# Patient Record
Sex: Male | Born: 1960
Health system: Southern US, Community
[De-identification: ages and names within clinical notes are randomized; demographics above are authoritative.]

## PROBLEM LIST (undated history)

## (undated) DIAGNOSIS — C439 Malignant melanoma of skin, unspecified: Secondary | ICD-10-CM

## (undated) DIAGNOSIS — M545 Low back pain, unspecified: Secondary | ICD-10-CM

## (undated) DIAGNOSIS — E785 Hyperlipidemia, unspecified: Secondary | ICD-10-CM

## (undated) DIAGNOSIS — T7840XA Allergy, unspecified, initial encounter: Secondary | ICD-10-CM

## (undated) DIAGNOSIS — N529 Male erectile dysfunction, unspecified: Secondary | ICD-10-CM

## (undated) DIAGNOSIS — I10 Essential (primary) hypertension: Secondary | ICD-10-CM

## (undated) DIAGNOSIS — G8929 Other chronic pain: Secondary | ICD-10-CM

## (undated) HISTORY — DX: Male erectile dysfunction, unspecified: N52.9

## (undated) HISTORY — DX: Low back pain, unspecified: M54.50

## (undated) HISTORY — DX: Allergy, unspecified, initial encounter: T78.40XA

## (undated) HISTORY — DX: Malignant melanoma of skin, unspecified: C43.9

## (undated) HISTORY — DX: Other chronic pain: G89.29

## (undated) HISTORY — DX: Hyperlipidemia, unspecified: E78.5

## (undated) HISTORY — PX: TONSILLECTOMY: SUR1361

## (undated) HISTORY — PX: BICEPS TENDON REPAIR: SHX566

## (undated) HISTORY — DX: Essential (primary) hypertension: I10

---

## 2010-04-17 ENCOUNTER — Emergency Department (HOSPITAL_COMMUNITY): Admission: EM | Admit: 2010-04-17 | Discharge: 2010-04-17 | Payer: Self-pay | Admitting: Family Medicine

## 2016-03-20 DIAGNOSIS — I10 Essential (primary) hypertension: Secondary | ICD-10-CM | POA: Diagnosis not present

## 2016-03-20 DIAGNOSIS — M545 Low back pain: Secondary | ICD-10-CM | POA: Diagnosis not present

## 2016-03-20 DIAGNOSIS — J309 Allergic rhinitis, unspecified: Secondary | ICD-10-CM | POA: Diagnosis not present

## 2016-03-20 DIAGNOSIS — L309 Dermatitis, unspecified: Secondary | ICD-10-CM | POA: Diagnosis not present

## 2016-05-16 DIAGNOSIS — R238 Other skin changes: Secondary | ICD-10-CM | POA: Diagnosis not present

## 2016-05-16 DIAGNOSIS — D485 Neoplasm of uncertain behavior of skin: Secondary | ICD-10-CM | POA: Diagnosis not present

## 2016-05-16 DIAGNOSIS — Z85828 Personal history of other malignant neoplasm of skin: Secondary | ICD-10-CM | POA: Diagnosis not present

## 2016-05-16 DIAGNOSIS — D225 Melanocytic nevi of trunk: Secondary | ICD-10-CM | POA: Diagnosis not present

## 2016-09-18 ENCOUNTER — Ambulatory Visit (INDEPENDENT_AMBULATORY_CARE_PROVIDER_SITE_OTHER): Payer: BLUE CROSS/BLUE SHIELD | Admitting: Allergy and Immunology

## 2016-09-18 ENCOUNTER — Encounter: Payer: Self-pay | Admitting: Allergy and Immunology

## 2016-09-18 VITALS — BP 148/80 | HR 77 | Temp 98.3°F | Resp 20 | Ht 68.5 in | Wt 184.2 lb

## 2016-09-18 DIAGNOSIS — H1013 Acute atopic conjunctivitis, bilateral: Secondary | ICD-10-CM

## 2016-09-18 DIAGNOSIS — J3089 Other allergic rhinitis: Secondary | ICD-10-CM

## 2016-09-18 DIAGNOSIS — H1010 Acute atopic conjunctivitis, unspecified eye: Secondary | ICD-10-CM

## 2016-09-18 DIAGNOSIS — R062 Wheezing: Secondary | ICD-10-CM

## 2016-09-18 HISTORY — DX: Acute atopic conjunctivitis, unspecified eye: H10.10

## 2016-09-18 HISTORY — DX: Other allergic rhinitis: J30.89

## 2016-09-18 HISTORY — DX: Wheezing: R06.2

## 2016-09-18 MED ORDER — AZELASTINE-FLUTICASONE 137-50 MCG/ACT NA SUSP: 1.0000 | Freq: Two times a day (BID) | NASAL | 5 refills | Status: DC | PRN

## 2016-09-18 MED ORDER — BECLOMETHASONE DIPROPIONATE 40 MCG/ACT IN AERS: 2.0000 | INHALATION_SPRAY | Freq: Two times a day (BID) | RESPIRATORY_TRACT | 1 refills | Status: DC

## 2016-09-18 MED ORDER — LEVOCETIRIZINE DIHYDROCHLORIDE 5 MG PO TABS: 5.0000 mg | ORAL_TABLET | Freq: Every evening | ORAL | 5 refills | Status: DC

## 2016-09-18 MED ORDER — OLOPATADINE HCL 0.2 % OP SOLN: 1.0000 [drp] | OPHTHALMIC | 5 refills | Status: AC

## 2016-09-18 MED ORDER — ALBUTEROL SULFATE 108 (90 BASE) MCG/ACT IN AEPB: 2.0000 | INHALATION_SPRAY | RESPIRATORY_TRACT | 1 refills | Status: AC

## 2016-09-18 NOTE — Assessment & Plan Note (Addendum)
   Aeroallergen avoidance measures have been discussed and provided in written form.  A prescription has been provided for levocetirizine, 5mg  daily as needed.  A prescription has been provided for Dymista (azelastine/fluticasone) nasal spray, 1 spray per nostril twice daily as needed. Proper nasal spray technique has been discussed and demonstrated.  I have also recommended nasal saline spray (i.e., Simply Saline) or nasal saline lavage (i.e., NeilMed) as needed prior to medicated nasal sprays. The risks and benefits of aeroallergen immunotherapy have been discussed. The patient is motivated to initiate immunotherapy if insurance coverage is favorable. He will let us know how he would like to proceed.

## 2016-09-18 NOTE — Assessment & Plan Note (Addendum)
The patient's history suggests asthma, however spirometry results today do not meet ATS criteria for that diagnosis.  For now, continue montelukast 10 mg daily bedtime.  A prescription has been provided for Qvar (beclomethasone) 40 g, 2 inhalations twice a day. To maximize pulmonary deposition, a spacer has been provided along with instructions for its proper administration with an HFA inhaler.  A prescription has been provided for ProAir Respiclick, 1-2 inhalations every 4-6 hours as needed.  Subjective and objective measures of pulmonary function will be followed and the treatment plan will be adjusted accordingly.

## 2016-09-18 NOTE — Patient Instructions (Addendum)
Perennial and seasonal allergic rhinitis  Aeroallergen avoidance measures have been discussed and provided in written form.  A prescription has been provided for levocetirizine, 5mg  daily as needed.  A prescription has been provided for Dymista (azelastine/fluticasone) nasal spray, 1 spray per nostril twice daily as needed. Proper nasal spray technique has been discussed and demonstrated.  I have also recommended nasal saline spray (i.e., Simply Saline) or nasal saline lavage (i.e., NeilMed) as needed prior to medicated nasal sprays. The risks and benefits of aeroallergen immunotherapy have been discussed. The patient is motivated to initiate immunotherapy if insurance coverage is favorable. He will let us know how he would like to proceed.  Allergic conjunctivitis  Treatment plan as outlined above for allergic rhinitis.  A prescription has been provided for Pazeo, one drop per eye daily as needed.  Dyspnea/wheezing The patient's history suggests asthma, however spirometry results today do not meet ATS criteria for that diagnosis.  For now, continue montelukast 10 mg daily bedtime.  A prescription has been provided for Qvar (beclomethasone) 40 g,  2 inhalations twice a day. To maximize pulmonary deposition, a spacer has been provided along with instructions for its proper administration with an HFA inhaler.  A prescription has been provided for ProAir Respiclick, 1-2 inhalations every 4-6 hours as needed.  Subjective and objective measures of pulmonary function will be followed and the treatment plan will be adjusted accordingly.   Return in about 3 months (around 12/19/2016), or if symptoms worsen or fail to improve.  Reducing Pollen Exposure  The American Academy of Allergy, Asthma and Immunology suggests the following steps to reduce your exposure to pollen during allergy seasons.    1. Do not hang sheets or clothing out to dry; pollen may collect on these items. 2. Do not mow  lawns or spend time around freshly cut grass; mowing stirs up pollen. 3. Keep windows closed at night.  Keep car windows closed while driving. 4. Minimize morning activities outdoors, a time when pollen counts are usually at their highest. 5. Stay indoors as much as possible when pollen counts or humidity is high and on windy days when pollen tends to remain in the air longer. 6. Use air conditioning when possible.  Many air conditioners have filters that trap the pollen spores. 7. Use a HEPA room air filter to remove pollen form the indoor air you breathe.   Control of House Dust Mite Allergen  House dust mites play a major role in allergic asthma and rhinitis.  They occur in environments with high humidity wherever human skin, the food for dust mites is found. High levels have been detected in dust obtained from mattresses, pillows, carpets, upholstered furniture, bed covers, clothes and soft toys.  The principal allergen of the house dust mite is found in its feces.  A gram of dust may contain 1,000 mites and 250,000 fecal particles.  Mite antigen is easily measured in the air during house cleaning activities.    1. Encase mattresses, including the box spring, and pillow, in an air tight cover.  Seal the zipper end of the encased mattresses with wide adhesive tape. 2. Wash the bedding in water of 130 degrees Farenheit weekly.  Avoid cotton comforters/quilts and flannel bedding: the most ideal bed covering is the dacron comforter. 3. Remove all upholstered furniture from the bedroom. 4. Remove carpets, carpet padding, rugs, and non-washable window drapes from the bedroom.  Wash drapes weekly or use plastic window coverings. 5. Remove all non-washable stuffed toys  from the bedroom.  Wash stuffed toys weekly. 6. Have the room cleaned frequently with a vacuum cleaner and a damp dust-mop.  The patient should not be in a room which is being cleaned and should wait 1 hour after cleaning before going  into the room. 7. Close and seal all heating outlets in the bedroom.  Otherwise, the room will become filled with dust-laden air.  An electric heater can be used to heat the room. Reduce indoor humidity to less than 50%.  Do not use a humidifier.  Control of Dog or Cat Allergen  Avoidance is the best way to manage a dog or cat allergy. If you have a dog or cat and are allergic to dog or cats, consider removing the dog or cat from the home. If you have a dog or cat but don't want to find it a new home, or if your family wants a pet even though someone in the household is allergic, here are some strategies that may help keep symptoms at bay:  1. Keep the pet out of your bedroom and restrict it to only a few rooms. Be advised that keeping the dog or cat in only one room will not limit the allergens to that room. 2. Don't pet, hug or kiss the dog or cat; if you do, wash your hands with soap and water. 3. High-efficiency particulate air (HEPA) cleaners run continuously in a bedroom or living room can reduce allergen levels over time. 4. Regular use of a high-efficiency vacuum cleaner or a central vacuum can reduce allergen levels. 5. Giving your dog or cat a bath at least once a week can reduce airborne allergen.  Control of Mold Allergen  Mold and fungi can grow on a variety of surfaces provided certain temperature and moisture conditions exist.  Outdoor molds grow on plants, decaying vegetation and soil.  The major outdoor mold, Alternaria and Cladosporium, are found in very high numbers during hot and dry conditions.  Generally, a late Summer - Fall peak is seen for common outdoor fungal spores.  Rain will temporarily lower outdoor mold spore count, but counts rise rapidly when the rainy period ends.  The most important indoor molds are Aspergillus and Penicillium.  Dark, humid and poorly ventilated basements are ideal sites for mold growth.  The next most common sites of mold growth are the bathroom  and the kitchen.  Outdoor [employer] 1. Use air conditioning and keep windows closed 2. Avoid exposure to decaying vegetation. 3. Avoid leaf raking. 4. Avoid grain handling. 5. Consider wearing a face mask if working in moldy areas.  Indoor Mold Control 1. Maintain humidity below 50%. 2. Clean washable surfaces with 5% bleach solution. 3. Remove sources e.g. Contaminated carpets.

## 2016-09-18 NOTE — Assessment & Plan Note (Signed)
   Treatment plan as outlined above for allergic rhinitis.  A prescription has been provided for Pazeo, one drop per eye daily as needed. 

## 2016-09-18 NOTE — Progress Notes (Signed)
New Patient Note  RE: Eric Kirk MRN: [id number] DOB: September 03, 1961 Date of Office Visit: 09/18/2016  Referring provider: No ref. provider found Primary care provider: Donnie Coffin, MD  Chief Complaint: Allergic Rhinitis ; Cough; and Wheezing   History of present illness: Eric Kirk is a 55 y.o. male presenting today for evaluation of rhinitis and possible asthma.  He reports that he has had allergic rhinitis since adolescence.  While he is a teenager he was started on aeroallergen immunotherapy, but discontinued this therapy after only one year and prior to reaching maintenance because his symptoms had improved.  However, his symptoms have gradually returned over the years.  He complains of nasal congestion, rhinorrhea, sneezing, nasal pruritus, ocular pruritus, and occasional sinus pressure.  These symptoms seem to be triggered by pollens exposure, dust exposure, and exposure to cats.  He reports that his symptoms have increased significantly over this past year while dating his girlfriend who has multiple cats and also acquiring a cat of his own.  He notes that his symptoms resolve completely when he travels out of town, only to resume when he returns home.  In addition to the nasal and ocular symptoms, he reports that over this past year he has been experiencing frequent coughing, dyspnea, and wheezing.  Several months ago he was prescribed montelukast with mild/moderate relief.  However, recently montelukast, loratadine/pseudoephedrine, and fluticasone nasal spray do not provide adequate symptom relief for his upper or lower respiratory tract symptoms.   Assessment and plan: Perennial and seasonal allergic rhinitis  Aeroallergen avoidance measures have been discussed and provided in written form.  A prescription has been provided for levocetirizine, 5mg  daily as needed.  A prescription has been provided for Dymista (azelastine/fluticasone) nasal spray, 1 spray per nostril twice daily  as needed. Proper nasal spray technique has been discussed and demonstrated.  I have also recommended nasal saline spray (i.e., Simply Saline) or nasal saline lavage (i.e., NeilMed) as needed prior to medicated nasal sprays. The risks and benefits of aeroallergen immunotherapy have been discussed. The patient is motivated to initiate immunotherapy if insurance coverage is favorable. He will let us know how he would like to proceed.  Allergic conjunctivitis  Treatment plan as outlined above for allergic rhinitis.  A prescription has been provided for Pazeo, one drop per eye daily as needed.  Dyspnea/wheezing The patient's history suggests asthma, however spirometry results today do not meet ATS criteria for that diagnosis.  For now, continue montelukast 10 mg daily bedtime.  A prescription has been provided for Qvar (beclomethasone) 40 g,  2 inhalations twice a day. To maximize pulmonary deposition, a spacer has been provided along with instructions for its proper administration with an HFA inhaler.  A prescription has been provided for ProAir Respiclick, 1-2 inhalations every 4-6 hours as needed.  Subjective and objective measures of pulmonary function will be followed and the treatment plan will be adjusted accordingly.   Meds ordered this encounter  Medications  . levocetirizine (XYZAL) 5 MG tablet    Sig: Take 1 tablet (5 mg total) by mouth every evening.    Dispense:  30 tablet    Refill:  5  . Azelastine-Fluticasone (DYMISTA) 137-50 MCG/ACT SUSP    Sig: Place 1 spray into both nostrils 2 (two) times daily as needed.    Dispense:  1 Bottle    Refill:  5  . Albuterol Sulfate (PROAIR RESPICLICK) 123XX123 (90 Base) MCG/ACT AEPB    Sig: Inhale 2 puffs into the  lungs every 4 (four) hours.    Dispense:  1 each    Refill:  1  . beclomethasone (QVAR) 40 MCG/ACT inhaler    Sig: Inhale 2 puffs into the lungs 2 (two) times daily.    Dispense:  8.7 g    Refill:  1  . Olopatadine HCl  (PATADAY) 0.2 % SOLN    Sig: Place 1 drop into both eyes 1 day or 1 dose.    Dispense:  1 Bottle    Refill:  5     Diagnostics: Spirometry: FVC was 4.93 L and FEV1 was 3.70 L without post bronchodilator improvement.  Please see scanned spirometry results for details. Allergy skin testing: Positive to grass pollen, ragweed pollen, weed pollens, molds, cat hair, dog epithelia, and dust mite antigen.    Physical examination: Blood pressure (!) 148/80, pulse 77, temperature 98.3 F (36.8 C), temperature source Oral, resp. rate 20, height 5' 8.5" (1.74 m), weight 184 lb 3.2 oz (83.6 kg), SpO2 94 %.  General: Alert, interactive, in no acute distress. HEENT: TMs pearly gray, turbinates edematous without discharge, post-pharynx moderately erythematous. Neck: Supple without lymphadenopathy. Lungs: Clear to auscultation without wheezing, rhonchi or rales. CV: Normal S1, S2 without murmurs. Abdomen: Nondistended, nontender. Skin: Warm and dry, without lesions or rashes. Extremities:  No clubbing, cyanosis or edema. Neuro:   Grossly intact.  Review of systems:  Review of systems negative except as noted in HPI / PMHx or noted below: Review of Systems  Constitutional: Negative.   HENT: Negative.   Eyes: Negative.   Respiratory: Negative.   Cardiovascular: Negative.   Gastrointestinal: Negative.   Genitourinary: Negative.   Musculoskeletal: Negative.   Skin: Negative.   Neurological: Negative.   Endo/Heme/Allergies: Negative.   Psychiatric/Behavioral: Negative.     Past medical history:  History reviewed. No pertinent past medical history.  Past surgical history:  History reviewed. No pertinent surgical history.  Family history: History reviewed. No pertinent family history.  Social history: Social History   Social History  . Marital status: Single    Spouse name: N/A  . Number of children: N/A  . Years of education: N/A   Occupational History  . Not on file.   Social  History Main Topics  . Smoking status: Never Smoker  . Smokeless tobacco: Never Used  . Alcohol use Yes  . Drug use: No  . Sexual activity: Yes    Partners: Female    Birth control/ protection: None   Other Topics Concern  . Not on file   Social History Narrative  . No narrative on file   Environmental History: The patient lives in an 56 year old house with hardwood floors throughout and central air/heat.  There are 3 cats in the house which have access to his bedroom.  He is a nonsmoker.    Medication List       Accurate as of 09/18/16  6:41 PM. Always use your most recent med list.          Albuterol Sulfate 108 (90 Base) MCG/ACT Aepb Commonly known as:  PROAIR RESPICLICK Inhale 2 puffs into the lungs every 4 (four) hours.   Azelastine-Fluticasone 137-50 MCG/ACT Susp Commonly known as:  DYMISTA Place 1 spray into both nostrils 2 (two) times daily as needed.   beclomethasone 40 MCG/ACT inhaler Commonly known as:  QVAR Inhale 2 puffs into the lungs 2 (two) times daily.   fluticasone 50 MCG/ACT nasal spray Commonly known as:  FLONASE Place 1 spray into  both nostrils daily.   levocetirizine 5 MG tablet Commonly known as:  XYZAL Take 1 tablet (5 mg total) by mouth every evening.   loratadine-pseudoephedrine 10-240 MG 24 hr tablet Commonly known as:  CLARITIN-D 24-hour Take 1 tablet by mouth daily as needed for allergies.   losartan-hydrochlorothiazide 50-12.5 MG tablet Commonly known as:  HYZAAR Take 1 tablet by mouth daily.   montelukast 10 MG tablet Commonly known as:  SINGULAIR Take 10 mg by mouth daily.   Olopatadine HCl 0.2 % Soln Commonly known as:  PATADAY Place 1 drop into both eyes 1 day or 1 dose.       Known medication allergies: No Known Allergies  I appreciate the opportunity to take part in Takashi's care. Please do not hesitate to contact me with questions.  Sincerely,   R. Edgar Frisk, MD

## 2016-09-19 ENCOUNTER — Telehealth: Payer: Self-pay

## 2016-09-19 MED ORDER — AZELASTINE HCL 0.15 % NA SOLN: 2.0000 | Freq: Two times a day (BID) | NASAL | 5 refills | Status: AC

## 2016-09-19 NOTE — Telephone Encounter (Signed)
Pt returned call - advsd of Dymista not being on his insurance formulary; sample Dymista and discount card will be pur up front for him to pick up.  Pt stated he has 2 bottles of Fluticasone (Flonase) and wants to know if he can just go back to using that instead of the [place].  Please advise.

## 2016-09-19 NOTE — Addendum Note (Signed)
Addended by: Martyn Malay on: 09/19/2016 03:35 PM   Modules accepted: Orders

## 2016-09-19 NOTE — Telephone Encounter (Signed)
He can take fluticasone.  It may be helpful to add azelastine nasal spray, 2 sprays per nostril twice a day.  Please call in the azelastine if his insurance covers it.  Also, please remind the patient to use nasal saline prior to medicated nasal sprays.

## 2016-09-25 ENCOUNTER — Telehealth: Payer: Self-pay | Admitting: Allergy and Immunology

## 2016-09-25 ENCOUNTER — Other Ambulatory Visit: Payer: Self-pay

## 2016-09-25 DIAGNOSIS — I10 Essential (primary) hypertension: Secondary | ICD-10-CM | POA: Diagnosis not present

## 2016-09-25 DIAGNOSIS — E7800 Pure hypercholesterolemia, unspecified: Secondary | ICD-10-CM | POA: Diagnosis not present

## 2016-09-25 DIAGNOSIS — Z23 Encounter for immunization: Secondary | ICD-10-CM | POA: Diagnosis not present

## 2016-09-25 DIAGNOSIS — Z125 Encounter for screening for malignant neoplasm of prostate: Secondary | ICD-10-CM | POA: Diagnosis not present

## 2016-09-25 DIAGNOSIS — Z0000 Encounter for general adult medical examination without abnormal findings: Secondary | ICD-10-CM | POA: Diagnosis not present

## 2016-09-25 DIAGNOSIS — J3089 Other allergic rhinitis: Secondary | ICD-10-CM

## 2016-09-25 MED ORDER — AZELASTINE-FLUTICASONE 137-50 MCG/ACT NA SUSP: 1.0000 | Freq: Two times a day (BID) | NASAL | 5 refills | Status: DC | PRN

## 2016-09-25 NOTE — Telephone Encounter (Signed)
Patient called and requested to speak to [facility]. He said there was some confusion with his prescriptions.

## 2016-09-25 NOTE — Telephone Encounter (Signed)
Pt req Dymista refill due to cost of Azelastine.

## 2016-09-25 NOTE — Telephone Encounter (Signed)
Returned pt cll - Pt requesting Rx for Dymista due to the cost for Azelastine being $84-$85. Pt states with the $14 coupon it much cheaper for him to go that route.   I will put in refill request doe Dymista for you to sign off on.

## 2016-09-25 NOTE — Telephone Encounter (Signed)
Dymista, 1-2 sp EN bid prn. Thanks.

## 2016-09-26 MED ORDER — AZELASTINE-FLUTICASONE 137-50 MCG/ACT NA SUSP: NASAL | 5 refills | Status: DC

## 2016-09-26 NOTE — Telephone Encounter (Signed)
Called in [place].  Noted patient has coupon for pharmacy.

## 2016-09-26 NOTE — Addendum Note (Signed)
Addended byOralia Rud M on: 09/26/2016 10:08 AM   Modules accepted: Orders

## 2016-11-16 ENCOUNTER — Other Ambulatory Visit: Payer: Self-pay | Admitting: Allergy and Immunology

## 2016-11-16 DIAGNOSIS — R062 Wheezing: Secondary | ICD-10-CM

## 2016-12-18 ENCOUNTER — Other Ambulatory Visit: Payer: Self-pay | Admitting: *Deleted

## 2016-12-18 MED ORDER — BECLOMETHASONE DIPROP HFA 40 MCG/ACT IN AERB: 2.0000 | INHALATION_SPRAY | Freq: Two times a day (BID) | RESPIRATORY_TRACT | 3 refills | Status: AC

## 2016-12-24 ENCOUNTER — Ambulatory Visit: Payer: BLUE CROSS/BLUE SHIELD | Admitting: Allergy and Immunology

## 2017-01-02 ENCOUNTER — Encounter: Payer: Self-pay | Admitting: Allergy & Immunology

## 2017-01-02 ENCOUNTER — Ambulatory Visit (INDEPENDENT_AMBULATORY_CARE_PROVIDER_SITE_OTHER): Payer: BLUE CROSS/BLUE SHIELD | Admitting: Allergy & Immunology

## 2017-01-02 VITALS — BP 136/84 | HR 82 | Resp 18

## 2017-01-02 DIAGNOSIS — J3089 Other allergic rhinitis: Secondary | ICD-10-CM

## 2017-01-02 DIAGNOSIS — J4530 Mild persistent asthma, uncomplicated: Secondary | ICD-10-CM | POA: Diagnosis not present

## 2017-01-02 NOTE — Patient Instructions (Addendum)
1. Perennial and seasonal allergic rhinitis - Continue with the fluticasone and azelastine.  - Ask pharmacist whether Patanase would be cheaper than azelastine. - Continue with Xyzal as needed. - Consider starting allergy shots for long-term control.   2. Mild persistent asthma, uncomplicated - Lung function looked good today. - We will not make any medication changes.   - Ask pharmacist whether Flovent would be cheaper than Qvar. - Daily controller medication(s): Qvar 25mcg two puffs twice daily - Rescue medications: ProAir 4 puffs every 4-6 hours as needed - Changes during respiratory infections or worsening symptoms: increase Qvar 13mcg to 4 puffs twice daily for TWO WEEKS. - Asthma control goals:  * Full participation in all desired activities (may need albuterol before activity) * Albuterol use two time or less a week on average (not counting use with activity) * Cough interfering with sleep two time or less a month * Oral steroids no more than once a year * No hospitalizations  3. Return in about 6 months (around 07/05/2017).  Please inform us of any Emergency Department visits, hospitalizations, or changes in symptoms. Call us before going to the ED for breathing or allergy symptoms since we might be able to fit you in for a sick visit. Feel free to contact us anytime with any questions, problems, or concerns.  It was a pleasure to meet you today! Happy spring!   Websites that have reliable patient information: 1. American Academy of Asthma, Allergy, and Immunology: www.aaaai.org 2. Food Allergy Research and Education (FARE): foodallergy.org 3. Mothers of Asthmatics: http://www.asthmacommunitynetwork.org 4. American College of Allergy, Asthma, and Immunology: www.acaai.org

## 2017-01-02 NOTE — Progress Notes (Signed)
FOLLOW UP  Date of Service/Encounter:  01/02/17   Assessment:   Perennial and seasonal allergic rhinitis  Mild persistent asthma, uncomplicated   Asthma Reportables:  Severity: mild persistent  Risk: low Control: well controlled   Plan/Recommendations:    1. Perennial and seasonal allergic rhinitis - Continue with the fluticasone and azelastine.  - Ask pharmacist whether Patanase would be cheaper than azelastine. - Continue with Xyzal as needed. - Consider starting allergy shots for long-term control.  - We did discuss allergen immunotherapy, but he was concerned with the costs as well as the time commitment. - Eric Kirk travels quite a bit for his job and therefore may not have the time to keep up with the shots. - I did discuss that he could get shots twice weekly to work up faster. - The typical trip is one week long, although he does go out of the country for trips up to 30 days at a time.   2. Mild persistent asthma, uncomplicated - Lung function looked good today. - We will not make any medication changes.   - Ask pharmacist whether Flovent would be cheaper than Qvar. - Daily controller medication(s): Qvar 68mcg two puffs twice daily - Rescue medications: ProAir 4 puffs every 4-6 hours as needed - Changes during respiratory infections or worsening symptoms: increase Qvar 34mcg to 4 puffs twice daily for TWO WEEKS. - Asthma control goals:  * Full participation in all desired activities (may need albuterol before activity) * Albuterol use two time or less a week on average (not counting use with activity) * Cough interfering with sleep two time or less a month * Oral steroids no more than once a year * No hospitalizations  3. Return in about 6 months (around 07/05/2017).   Subjective:   Eric Kirk is a 57 y.o. male presenting today for follow up of  Chief Complaint  Patient presents with  . Allergic Rhinitis     Doing Better since last OV  . Nasal  Congestion    Dymista does help    Eric Kirk has a history of the following: Patient Active Problem List   Diagnosis Date Noted  . Perennial and seasonal allergic rhinitis 09/18/2016  . Allergic conjunctivitis 09/18/2016  . Dyspnea/wheezing 09/18/2016    History obtained from: chart review and patient.  Eric Kirk was referred by Donnie Coffin, MD.     Eric Kirk is a 56 y.o. male presenting for a follow up visit. He was last seen in November 2017 by Dr. Verlin Fester is a new patient. At that time, he was having allergic rhinitis symptoms have continued since adolescence. He had testing that was positive to pollen, ragweed, weeds, molds, cat, dog, and dust mite. He was started on Qvar 40 g 2 inhalations twice daily as well as Singulair 10 mg. For his allergies, he was started on Dymista as well as Xyzal. He was also given a prescription for Pazeo.  Since the last visit, he has done fairly well. He is very pleased with how well he is doing. He does use his Qvar but feels like the Dymista has worked the best. He has not needed his rescue inhaler at all. Eric Kirk's asthma has been well controlled. He has not required rescue medication, experienced nocturnal awakenings due to lower respiratory symptoms, nor have activities of daily living been limited. He has had no ED visits or Urgent Care visits. He has not needed prednisone.   Eric Kirk does report that  his medications are rather pricey. He is currently spending around $200 monthly for the two sprays separately. He has been using Flonase for the last two weeks since he ran out of the azelastine and he can tell a worsening of his symptoms without the activity of the azelastine. He did think about allergy shots but he has reservations with it because he travels a lot for his job. Therefore the ability to increase his dose will be hampered. He did check with his insurance company about coverage but cannot remember how much it would be.   Otherwise, there  have been no changes to his past medical history, surgical history, family history, or social history.    Review of Systems: a 14-point review of systems is pertinent for what is mentioned in HPI.  Otherwise, all other systems were negative. Constitutional: negative other than that listed in the HPI Eyes: negative other than that listed in the HPI Ears, nose, mouth, throat, and face: negative other than that listed in the HPI Respiratory: negative other than that listed in the HPI Cardiovascular: negative other than that listed in the HPI Gastrointestinal: negative other than that listed in the HPI Genitourinary: negative other than that listed in the HPI Integument: negative other than that listed in the HPI Hematologic: negative other than that listed in the HPI Musculoskeletal: negative other than that listed in the HPI Neurological: negative other than that listed in the HPI Allergy/Immunologic: negative other than that listed in the HPI    Objective:   Blood pressure 136/84, pulse 82, resp. rate 18, SpO2 97 %. There is no height or weight on file to calculate BMI.   Physical Exam:  General: Alert, interactive, in no acute distress. Cooperative with the exam. Very friendly.  Eyes: No conjunctival injection present on the right, No conjunctival injection present on the left, PERRL bilaterally, No discharge on the right, No discharge on the left and No Horner-Trantas dots present Ears: Right TM pearly gray with normal light reflex, Left TM pearly gray with normal light reflex, Right TM intact without perforation and Left TM intact without perforation.  Nose/Throat: External nose within normal limits and septum midline, turbinates edematous and pale with clear discharge, post-pharynx erythematous without cobblestoning in the posterior oropharynx. Tonsils 2+ without exudates Neck: Supple without thyromegaly. Lungs: Clear to auscultation without wheezing, rhonchi or rales. No  increased work of breathing. CV: Normal S1/S2, no murmurs. Capillary refill <2 seconds.  Skin: Warm and dry, without lesions or rashes. Neuro:   Grossly intact. No focal deficits appreciated. Responsive to questions.   Diagnostic studies:  Spirometry: results normal (FEV1: 3.41/98%, FVC: 4.77/110%, FEV1/FVC: 71%).    Spirometry consistent with normal pattern.   Allergy Studies: None    Salvatore Marvel, MD [place] [facility]

## 2017-03-26 DIAGNOSIS — J309 Allergic rhinitis, unspecified: Secondary | ICD-10-CM | POA: Diagnosis not present

## 2017-03-26 DIAGNOSIS — E7800 Pure hypercholesterolemia, unspecified: Secondary | ICD-10-CM | POA: Diagnosis not present

## 2017-03-26 DIAGNOSIS — I10 Essential (primary) hypertension: Secondary | ICD-10-CM | POA: Diagnosis not present

## 2017-03-26 DIAGNOSIS — L309 Dermatitis, unspecified: Secondary | ICD-10-CM | POA: Diagnosis not present

## 2017-03-29 ENCOUNTER — Other Ambulatory Visit: Payer: Self-pay | Admitting: Allergy and Immunology

## 2017-03-29 DIAGNOSIS — H1013 Acute atopic conjunctivitis, bilateral: Secondary | ICD-10-CM

## 2017-03-29 DIAGNOSIS — J3089 Other allergic rhinitis: Secondary | ICD-10-CM

## 2017-07-08 ENCOUNTER — Ambulatory Visit: Payer: BLUE CROSS/BLUE SHIELD | Admitting: Allergy & Immunology

## 2017-07-08 DIAGNOSIS — J309 Allergic rhinitis, unspecified: Secondary | ICD-10-CM

## 2017-07-10 ENCOUNTER — Other Ambulatory Visit: Payer: Self-pay | Admitting: Allergy and Immunology

## 2017-07-10 DIAGNOSIS — J3089 Other allergic rhinitis: Secondary | ICD-10-CM

## 2017-07-10 DIAGNOSIS — H1013 Acute atopic conjunctivitis, bilateral: Secondary | ICD-10-CM

## 2017-10-14 DIAGNOSIS — Z0000 Encounter for general adult medical examination without abnormal findings: Secondary | ICD-10-CM | POA: Diagnosis not present

## 2017-10-14 DIAGNOSIS — E7800 Pure hypercholesterolemia, unspecified: Secondary | ICD-10-CM | POA: Diagnosis not present

## 2017-10-14 DIAGNOSIS — Z125 Encounter for screening for malignant neoplasm of prostate: Secondary | ICD-10-CM | POA: Diagnosis not present

## 2017-10-14 DIAGNOSIS — I10 Essential (primary) hypertension: Secondary | ICD-10-CM | POA: Diagnosis not present

## 2017-10-14 DIAGNOSIS — Z23 Encounter for immunization: Secondary | ICD-10-CM | POA: Diagnosis not present

## 2017-10-14 DIAGNOSIS — Z1211 Encounter for screening for malignant neoplasm of colon: Secondary | ICD-10-CM | POA: Diagnosis not present

## 2017-10-31 DIAGNOSIS — R739 Hyperglycemia, unspecified: Secondary | ICD-10-CM | POA: Diagnosis not present

## 2017-11-21 ENCOUNTER — Other Ambulatory Visit: Payer: Self-pay

## 2017-11-21 DIAGNOSIS — H1013 Acute atopic conjunctivitis, bilateral: Secondary | ICD-10-CM

## 2017-11-21 NOTE — Telephone Encounter (Signed)
Received fax for 90 day supply of olopatadine 0.2%. Patient was last seen 01/02/2017. Patient was to return in 6 months. Patient needs office visit.

## 2017-12-27 DIAGNOSIS — M5136 Other intervertebral disc degeneration, lumbar region: Secondary | ICD-10-CM | POA: Diagnosis not present

## 2017-12-27 DIAGNOSIS — M47816 Spondylosis without myelopathy or radiculopathy, lumbar region: Secondary | ICD-10-CM | POA: Diagnosis not present

## 2017-12-27 DIAGNOSIS — M5126 Other intervertebral disc displacement, lumbar region: Secondary | ICD-10-CM | POA: Diagnosis not present

## 2017-12-27 DIAGNOSIS — M546 Pain in thoracic spine: Secondary | ICD-10-CM | POA: Diagnosis not present

## 2017-12-27 DIAGNOSIS — M5416 Radiculopathy, lumbar region: Secondary | ICD-10-CM | POA: Diagnosis not present

## 2017-12-27 DIAGNOSIS — Q7649 Other congenital malformations of spine, not associated with scoliosis: Secondary | ICD-10-CM | POA: Diagnosis not present

## 2018-02-09 ENCOUNTER — Other Ambulatory Visit: Payer: Self-pay | Admitting: Allergy and Immunology

## 2018-02-09 DIAGNOSIS — J3089 Other allergic rhinitis: Secondary | ICD-10-CM

## 2018-02-09 DIAGNOSIS — H1013 Acute atopic conjunctivitis, bilateral: Secondary | ICD-10-CM

## 2018-03-08 ENCOUNTER — Other Ambulatory Visit: Payer: Self-pay | Admitting: Allergy and Immunology

## 2018-03-08 DIAGNOSIS — H1013 Acute atopic conjunctivitis, bilateral: Secondary | ICD-10-CM

## 2018-03-08 DIAGNOSIS — J3089 Other allergic rhinitis: Secondary | ICD-10-CM

## 2018-04-08 DIAGNOSIS — Z1159 Encounter for screening for other viral diseases: Secondary | ICD-10-CM | POA: Diagnosis not present

## 2018-04-08 DIAGNOSIS — J309 Allergic rhinitis, unspecified: Secondary | ICD-10-CM | POA: Diagnosis not present

## 2018-04-08 DIAGNOSIS — L309 Dermatitis, unspecified: Secondary | ICD-10-CM | POA: Diagnosis not present

## 2018-04-08 DIAGNOSIS — E7800 Pure hypercholesterolemia, unspecified: Secondary | ICD-10-CM | POA: Diagnosis not present

## 2018-04-08 DIAGNOSIS — I10 Essential (primary) hypertension: Secondary | ICD-10-CM | POA: Diagnosis not present

## 2018-11-11 DIAGNOSIS — Z0000 Encounter for general adult medical examination without abnormal findings: Secondary | ICD-10-CM | POA: Diagnosis not present

## 2018-11-11 DIAGNOSIS — E7800 Pure hypercholesterolemia, unspecified: Secondary | ICD-10-CM | POA: Diagnosis not present

## 2018-11-11 DIAGNOSIS — I10 Essential (primary) hypertension: Secondary | ICD-10-CM | POA: Diagnosis not present

## 2018-11-11 DIAGNOSIS — Z1211 Encounter for screening for malignant neoplasm of colon: Secondary | ICD-10-CM | POA: Diagnosis not present

## 2018-11-11 DIAGNOSIS — Z125 Encounter for screening for malignant neoplasm of prostate: Secondary | ICD-10-CM | POA: Diagnosis not present

## 2018-11-24 DIAGNOSIS — Z85820 Personal history of malignant melanoma of skin: Secondary | ICD-10-CM | POA: Diagnosis not present

## 2018-11-24 DIAGNOSIS — D2262 Melanocytic nevi of left upper limb, including shoulder: Secondary | ICD-10-CM | POA: Diagnosis not present

## 2018-11-24 DIAGNOSIS — Z85828 Personal history of other malignant neoplasm of skin: Secondary | ICD-10-CM | POA: Diagnosis not present

## 2018-11-24 DIAGNOSIS — D225 Melanocytic nevi of trunk: Secondary | ICD-10-CM | POA: Diagnosis not present

## 2019-05-09 DIAGNOSIS — R040 Epistaxis: Secondary | ICD-10-CM | POA: Diagnosis not present

## 2019-05-12 DIAGNOSIS — R350 Frequency of micturition: Secondary | ICD-10-CM | POA: Diagnosis not present

## 2019-05-12 DIAGNOSIS — E7800 Pure hypercholesterolemia, unspecified: Secondary | ICD-10-CM | POA: Diagnosis not present

## 2019-05-12 DIAGNOSIS — J309 Allergic rhinitis, unspecified: Secondary | ICD-10-CM | POA: Diagnosis not present

## 2019-05-12 DIAGNOSIS — L309 Dermatitis, unspecified: Secondary | ICD-10-CM | POA: Diagnosis not present

## 2019-05-12 DIAGNOSIS — I10 Essential (primary) hypertension: Secondary | ICD-10-CM | POA: Diagnosis not present

## 2019-11-27 DIAGNOSIS — D2261 Melanocytic nevi of right upper limb, including shoulder: Secondary | ICD-10-CM | POA: Diagnosis not present

## 2019-11-27 DIAGNOSIS — Z85828 Personal history of other malignant neoplasm of skin: Secondary | ICD-10-CM | POA: Diagnosis not present

## 2019-11-27 DIAGNOSIS — D2262 Melanocytic nevi of left upper limb, including shoulder: Secondary | ICD-10-CM | POA: Diagnosis not present

## 2019-11-27 DIAGNOSIS — L821 Other seborrheic keratosis: Secondary | ICD-10-CM | POA: Diagnosis not present

## 2019-12-04 DIAGNOSIS — Z0000 Encounter for general adult medical examination without abnormal findings: Secondary | ICD-10-CM | POA: Diagnosis not present

## 2019-12-08 DIAGNOSIS — E7800 Pure hypercholesterolemia, unspecified: Secondary | ICD-10-CM | POA: Diagnosis not present

## 2019-12-08 DIAGNOSIS — Z125 Encounter for screening for malignant neoplasm of prostate: Secondary | ICD-10-CM | POA: Diagnosis not present

## 2019-12-08 DIAGNOSIS — Z23 Encounter for immunization: Secondary | ICD-10-CM | POA: Diagnosis not present

## 2019-12-08 DIAGNOSIS — I10 Essential (primary) hypertension: Secondary | ICD-10-CM | POA: Diagnosis not present

## 2019-12-09 ENCOUNTER — Encounter: Payer: Self-pay | Admitting: Cardiology

## 2019-12-09 DIAGNOSIS — Z1211 Encounter for screening for malignant neoplasm of colon: Secondary | ICD-10-CM | POA: Diagnosis not present

## 2020-01-14 ENCOUNTER — Ambulatory Visit: Payer: Self-pay | Attending: Internal Medicine

## 2020-01-14 DIAGNOSIS — Z23 Encounter for immunization: Secondary | ICD-10-CM

## 2020-01-14 NOTE — Progress Notes (Signed)
   Covid-19 Vaccination Clinic  Name:  Eric Kirk    MRN: [id number] DOB: [birth year]  01/14/2020  [name] was observed post Covid-19 immunization for 15 minutes without incident. He was provided with Vaccine Information Sheet and instruction to access the V-Safe system.   [name] was instructed to call 911 with any severe reactions post vaccine: Marland Kitchen Difficulty breathing  . Swelling of face and throat  . A fast heartbeat  . A bad rash all over body  . Dizziness and weakness   Immunizations Administered    Name Date Dose VIS Date Route   Pfizer COVID-19 Vaccine 01/14/2020  9:59 AM 0.3 mL 10/09/2019 Intramuscular   Manufacturer: [place]   Lot: [id number]   [place]: [id number]

## 2020-02-08 ENCOUNTER — Ambulatory Visit: Payer: Self-pay | Attending: Internal Medicine

## 2020-02-08 DIAGNOSIS — Z23 Encounter for immunization: Secondary | ICD-10-CM

## 2020-02-08 NOTE — Progress Notes (Signed)
   Covid-19 Vaccination Clinic  Name:  Eric Kirk    MRN: [id number] DOB: 06-20-1961  02/08/2020  Mr. [name] was observed post Covid-19 immunization for 15 minutes without incident. He was provided with Vaccine Information Sheet and instruction to access the V-Safe system.   Mr. [name] was instructed to call 911 with any severe reactions post vaccine: Marland Kitchen Difficulty breathing  . Swelling of face and throat  . A fast heartbeat  . A bad rash all over body  . Dizziness and weakness   Immunizations Administered    Name Date Dose VIS Date Route   Pfizer COVID-19 Vaccine 02/08/2020  9:36 AM 0.3 mL 10/09/2019 Intramuscular   Manufacturer: [place]   Lot: [id number]   [place]: [id number]

## 2020-02-10 DIAGNOSIS — S92912A Unspecified fracture of left toe(s), initial encounter for closed fracture: Secondary | ICD-10-CM | POA: Diagnosis not present

## 2020-02-10 DIAGNOSIS — M79675 Pain in left toe(s): Secondary | ICD-10-CM | POA: Insufficient documentation

## 2020-02-16 DIAGNOSIS — M25522 Pain in left elbow: Secondary | ICD-10-CM | POA: Insufficient documentation

## 2020-02-29 DIAGNOSIS — S46212A Strain of muscle, fascia and tendon of other parts of biceps, left arm, initial encounter: Secondary | ICD-10-CM | POA: Diagnosis not present

## 2020-02-29 DIAGNOSIS — S46292A Other injury of muscle, fascia and tendon of other parts of biceps, left arm, initial encounter: Secondary | ICD-10-CM | POA: Diagnosis not present

## 2020-02-29 DIAGNOSIS — Y999 Unspecified external cause status: Secondary | ICD-10-CM | POA: Diagnosis not present

## 2020-02-29 DIAGNOSIS — G8918 Other acute postprocedural pain: Secondary | ICD-10-CM | POA: Diagnosis not present

## 2020-02-29 DIAGNOSIS — X58XXXA Exposure to other specified factors, initial encounter: Secondary | ICD-10-CM | POA: Diagnosis not present

## 2020-03-07 DIAGNOSIS — M25522 Pain in left elbow: Secondary | ICD-10-CM | POA: Diagnosis not present

## 2020-03-09 DIAGNOSIS — Z4789 Encounter for other orthopedic aftercare: Secondary | ICD-10-CM | POA: Insufficient documentation

## 2020-03-15 DIAGNOSIS — Z7189 Other specified counseling: Secondary | ICD-10-CM | POA: Insufficient documentation

## 2020-03-15 DIAGNOSIS — R9431 Abnormal electrocardiogram [ECG] [EKG]: Secondary | ICD-10-CM | POA: Insufficient documentation

## 2020-03-15 HISTORY — DX: Abnormal electrocardiogram (ECG) (EKG): R94.31

## 2020-03-15 HISTORY — DX: Other specified counseling: Z71.89

## 2020-03-15 NOTE — Progress Notes (Signed)
Cardiology Office Note   Date:  03/16/2020   ID:  Eric Kirk Kirk, DOB Dec 14, 1960, MRN [id number]  PCP:  Eric Kirk Graff.Eric Kirk Sa, MD  Cardiologist:   No primary care provider on file. Referring:  Eric Kirk Kirk, L.Eric Kirk Sa, MD  Chief Complaint  Patient presents with  . Abnormal ECG      History of Present Illness: Eric Kirk Kirk is a 59 y.o. male who is referred by Eric Kirk Kirk, L.Eric Kirk Sa, MD for evaluation of an abnormal EKG. he was found to have left bundle branch block when he had elbow surgery recently.  This is new compared to 2005 EKGs.  He has not had any other cardiac work-up or cardiac problems in the past.  He was going to the gym before they closed.  He has been walking since Covid.  He denies any cardiovascular symptoms. The patient denies any new symptoms such as chest discomfort, neck or arm discomfort. There has been no new shortness of breath, PND or orthopnea. There have been no reported palpitations, presyncope or syncope.   Past Medical History:  Diagnosis Date  . Allergies    Cats  . Chronic low back pain   . Dyslipidemia   . Erectile dysfunction   . HTN (hypertension)   . Melanoma [facility])     Past Surgical History:  Procedure Laterality Date  . BICEPS TENDON REPAIR    . TONSILLECTOMY       Current Outpatient Medications  Medication Sig Dispense Refill  . Albuterol Sulfate (PROAIR RESPICLICK) 123XX123 (90 Base) MCG/ACT AEPB Inhale 2 puffs into the lungs every 4 (four) hours. (Patient not taking: Reported on 01/02/2017) 1 each 1  . Azelastine HCl 0.15 % SOLN Place 2 sprays into both nostrils 2 (two) times daily. (Patient not taking: Reported on 01/02/2017) 30 mL 5  . Azelastine-Fluticasone (DYMISTA) 137-50 MCG/ACT SUSP One to two sprays each nostril twice a day as needed. 23 g 5  . Beclomethasone Diprop HFA (QVAR REDIHALER) 40 MCG/ACT AERB Inhale 2 puffs into the lungs 2 (two) times daily. 1 Inhaler 3  . fluticasone (FLONASE) 50 MCG/ACT nasal spray Place 1 spray into both nostrils daily.      Marland Kitchen levocetirizine (XYZAL) 5 MG tablet TAKE 1 TABLET BY MOUTH EVERY EVENING 30 tablet 2  . loratadine-pseudoephedrine (CLARITIN-D 24-HOUR) 10-240 MG 24 hr tablet Take 1 tablet by mouth daily as needed for allergies.    Marland Kitchen losartan-hydrochlorothiazide (HYZAAR) 50-12.5 MG tablet Take 1 tablet by mouth daily.    . montelukast (SINGULAIR) 10 MG tablet Take 10 mg by mouth daily.    . Olopatadine HCl (PATADAY) 0.2 % SOLN Place 1 drop into both eyes 1 day or 1 dose. 1 Bottle 5  . QVAR 40 MCG/ACT inhaler INHALE 2 PUFFS INTO THE LUNGS TWICE A DAY (Patient not taking: Reported on 01/02/2017) 8.7 g 3   No current facility-administered medications for this visit.    Allergies:   Patient has no known allergies.    Social History:  The patient  reports that he quit smoking about 40 years ago. His smoking use included cigarettes. He has never used smokeless tobacco. He reports current alcohol use. He reports that he does not use drugs.   Family History:  The patient's family history includes Asthma in his son; CAD (age of onset: 19) in his mother; Lung cancer in his father.    ROS:  Please see the history of present illness.   Otherwise, review of systems are positive for none.  All other systems are reviewed and negative.    PHYSICAL EXAM: VS:  BP 135/70   Pulse 72   Temp (!) 96.8 F (36 C)   Ht 5\' 11"  (1.803 m)   Wt 185 lb (83.9 kg)   SpO2 94%   BMI 25.80 kg/m  , BMI Body mass index is 25.8 kg/m. GENERAL:  Well appearing HEENT:  Pupils equal round and reactive, fundi not visualized, oral mucosa unremarkable NECK:  No jugular venous distention, waveform within normal limits, carotid upstroke brisk and symmetric, no bruits, no thyromegaly LYMPHATICS:  No cervical, inguinal adenopathy LUNGS:  Clear to auscultation bilaterally BACK:  No CVA tenderness CHEST:  Unremarkable HEART:  PMI not displaced or sustained,S1 and S2 within normal limits, no S3, no S4, no clicks, no rubs, 3 out of 6 apical mid  to later peaking systolic murmur radiating up the aortic outflow tract and into the carotids, no diastolic murmurs ABD:  Flat, positive bowel sounds normal in frequency in pitch, no bruits, no rebound, no guarding, no midline pulsatile mass, no hepatomegaly, no splenomegaly EXT:  2 plus pulses throughout, no edema, no cyanosis no clubbing SKIN:  No rashes no nodules NEURO:  Cranial nerves II through XII grossly intact, motor grossly intact throughout PSYCH:  Cognitively intact, oriented to person place and time    EKG:  EKG is ordered today. The ekg ordered today demonstrates left bundle branch block, rate 72, axis within normal limits, intervals within normal limits, no acute ST-T wave changes.   Recent Labs: No results found for requested labs within last 8760 hours.    Lipid Panel No results found for: CHOL, TRIG, HDL, CHOLHDL, VLDL, LDLCALC, LDLDIRECT    Wt Readings from Last 3 Encounters:  03/16/20 185 lb (83.9 kg)  09/18/16 184 lb 3.2 oz (83.6 kg)      Other studies Reviewed: Additional studies/ records that were reviewed today include: Office records. Review of the above records demonstrates:  Please see elsewhere in the note.     ASSESSMENT AND PLAN:  ABNORMAL EKG:   The patient has a left bundle branch block.  This is possibly related to the murmur which I suspect to be aortic stenosis.  I Minna start with an echocardiogram.  Further evaluation will be based on these results.  I am going to have a low threshold for at least coronary calcium scoring as well pending the results of the echo.  HTN:   Blood pressures well controlled.  No change in therapy.  COVID EDUCATION:   The patient has had his vaccine.  Current medicines are reviewed at length with the patient today.  The patient does not have concerns regarding medicines.  The following changes have been made:  no change  Labs/ tests ordered today include:   Orders Placed This Encounter  Procedures  . EKG  12-Lead  . ECHOCARDIOGRAM COMPLETE     Disposition:   FU with me in six months or sooner based on the results of the above.   Signed, Minus Breeding, MD  03/16/2020 2:28 PM    [place]

## 2020-03-16 ENCOUNTER — Encounter: Payer: Self-pay | Admitting: Cardiology

## 2020-03-16 ENCOUNTER — Other Ambulatory Visit: Payer: Self-pay

## 2020-03-16 ENCOUNTER — Ambulatory Visit: Payer: BC Managed Care – PPO | Admitting: Cardiology

## 2020-03-16 VITALS — BP 135/70 | HR 72 | Temp 96.8°F | Ht 71.0 in | Wt 185.0 lb

## 2020-03-16 DIAGNOSIS — Z7189 Other specified counseling: Secondary | ICD-10-CM

## 2020-03-16 DIAGNOSIS — R9431 Abnormal electrocardiogram [ECG] [EKG]: Secondary | ICD-10-CM | POA: Diagnosis not present

## 2020-03-16 DIAGNOSIS — R011 Cardiac murmur, unspecified: Secondary | ICD-10-CM | POA: Diagnosis not present

## 2020-03-16 NOTE — Patient Instructions (Signed)
Medication Instructions:  NO CHANGES *If you need a refill on your cardiac medications before your next appointment, please call your pharmacy*  Lab Work: NONE ORDERED THIS VISIT  Testing/Procedures: Your physician has requested that you have an echocardiogram. Echocardiography is a painless test that uses sound waves to create images of your heart. It provides your doctor with information about the size and shape of your heart and how well your heart's chambers and valves are working. This procedure takes approximately one hour. There are no restrictions for this procedure. [place]  Follow-Up: At [facility], you and your health needs are our priority.  As part of our continuing mission to provide you with exceptional heart care, we have created designated Provider Care Teams.  These Care Teams include your primary Cardiologist (physician) and Advanced Practice Providers (APPs -  Physician Assistants and Nurse Practitioners) who all work together to provide you with the care you need, when you need it.  Your next appointment:   6 month(s) You will receive a reminder letter in the mail two months in advance. If you don't receive a letter, please call our office to schedule the follow-up appointment.  The format for your next appointment:   In Person  Provider:   Minus Breeding, MD

## 2020-03-21 DIAGNOSIS — M25522 Pain in left elbow: Secondary | ICD-10-CM | POA: Diagnosis not present

## 2020-04-01 DIAGNOSIS — M25522 Pain in left elbow: Secondary | ICD-10-CM | POA: Diagnosis not present

## 2020-04-07 DIAGNOSIS — M25522 Pain in left elbow: Secondary | ICD-10-CM | POA: Diagnosis not present

## 2020-04-11 ENCOUNTER — Ambulatory Visit (HOSPITAL_COMMUNITY): Payer: BC Managed Care – PPO | Attending: Cardiology

## 2020-04-11 ENCOUNTER — Other Ambulatory Visit: Payer: Self-pay

## 2020-04-11 DIAGNOSIS — R011 Cardiac murmur, unspecified: Secondary | ICD-10-CM | POA: Diagnosis not present

## 2020-04-13 DIAGNOSIS — M25522 Pain in left elbow: Secondary | ICD-10-CM | POA: Diagnosis not present

## 2020-04-18 ENCOUNTER — Telehealth: Payer: Self-pay

## 2020-04-18 DIAGNOSIS — R9431 Abnormal electrocardiogram [ECG] [EKG]: Secondary | ICD-10-CM

## 2020-04-18 NOTE — Telephone Encounter (Signed)
Spoke with patient. Reviewed recommendation from Dr. Percival Spanish. Patient is going to consider CT calcium score, will call back to schedule or cancel order.

## 2020-04-18 NOTE — Telephone Encounter (Signed)
-----   Message from Minus Breeding, MD sent at 04/17/2020  8:54 PM EDT ----- There is some septal hypertrophy.  I will follow this with another echo in one year.  No symptoms associated with this and no need for a change in therapy.  I would like for him to have a coronary calcium score and see me in a couple of months.  Call Mr. Grove with the results and send results to [facility], L.Marlou Sa, MD

## 2020-04-20 DIAGNOSIS — M25522 Pain in left elbow: Secondary | ICD-10-CM | POA: Diagnosis not present

## 2020-04-27 DIAGNOSIS — M25522 Pain in left elbow: Secondary | ICD-10-CM | POA: Diagnosis not present

## 2020-05-04 DIAGNOSIS — M25522 Pain in left elbow: Secondary | ICD-10-CM | POA: Diagnosis not present

## 2020-05-11 DIAGNOSIS — M25522 Pain in left elbow: Secondary | ICD-10-CM | POA: Diagnosis not present

## 2020-05-17 DIAGNOSIS — M25522 Pain in left elbow: Secondary | ICD-10-CM | POA: Diagnosis not present

## 2020-05-25 ENCOUNTER — Telehealth: Payer: Self-pay | Admitting: Cardiology

## 2020-05-25 NOTE — Telephone Encounter (Signed)
Patient is due for follow up 07/2020 but has appt in August and wants to wait til he is seen before scheduling another appointment

## 2020-06-06 ENCOUNTER — Ambulatory Visit (INDEPENDENT_AMBULATORY_CARE_PROVIDER_SITE_OTHER)
Admission: RE | Admit: 2020-06-06 | Discharge: 2020-06-06 | Disposition: A | Discharge status: Home / Self Care | Payer: Self-pay | Source: Ambulatory Visit | Attending: Cardiology | Admitting: Cardiology

## 2020-06-06 ENCOUNTER — Other Ambulatory Visit: Payer: Self-pay

## 2020-06-06 DIAGNOSIS — R9431 Abnormal electrocardiogram [ECG] [EKG]: Secondary | ICD-10-CM

## 2020-06-10 DIAGNOSIS — E7800 Pure hypercholesterolemia, unspecified: Secondary | ICD-10-CM | POA: Diagnosis not present

## 2020-06-10 DIAGNOSIS — Z23 Encounter for immunization: Secondary | ICD-10-CM | POA: Diagnosis not present

## 2020-06-10 DIAGNOSIS — L309 Dermatitis, unspecified: Secondary | ICD-10-CM | POA: Diagnosis not present

## 2020-06-10 DIAGNOSIS — J309 Allergic rhinitis, unspecified: Secondary | ICD-10-CM | POA: Diagnosis not present

## 2020-06-10 DIAGNOSIS — I10 Essential (primary) hypertension: Secondary | ICD-10-CM | POA: Diagnosis not present

## 2020-06-16 DIAGNOSIS — I517 Cardiomegaly: Secondary | ICD-10-CM

## 2020-06-16 DIAGNOSIS — R931 Abnormal findings on diagnostic imaging of heart and coronary circulation: Secondary | ICD-10-CM

## 2020-06-16 DIAGNOSIS — I10 Essential (primary) hypertension: Secondary | ICD-10-CM | POA: Insufficient documentation

## 2020-06-16 DIAGNOSIS — I422 Other hypertrophic cardiomyopathy: Secondary | ICD-10-CM | POA: Insufficient documentation

## 2020-06-16 HISTORY — DX: Abnormal findings on diagnostic imaging of heart and coronary circulation: R93.1

## 2020-06-16 HISTORY — DX: Cardiomegaly: I51.7

## 2020-06-16 NOTE — Progress Notes (Signed)
Cardiology Office Note   Date:  06/17/2020   ID:  Eric Kirk, DOB 23-Oct-1961, MRN [id number]  PCP:  Aurea Graff.Marlou Sa, MD  Cardiologist:   No primary care provider on file.   Chief Complaint  Patient presents with  . Abnormal ECG      History of Present Illness: Eric Kirk is a 59 y.o. male who was referred by Alroy Dust, L.Marlou Sa, MD for evaluation of an abnormal EKG.   He was found to have left bundle branch block new compared to 2005 EKGs.   I sent him for an echo and he had septal hypertrophy.  He had mild coronary calcium on coronary calcium score.  He returns to discuss these findings.     He has had no new symptoms.  He denies any chest pressure, neck or arm discomfort.  He does some activities.  He has some back pain.  He has no new shortness of breath, PND or orthopnea.  He has no palpitations, presyncope or syncope.  He has had no weight gain.  He does have occasional edema when he travels.     Past Medical History:  Diagnosis Date  . Allergies    Cats  . Chronic low back pain   . Dyslipidemia   . Erectile dysfunction   . HTN (hypertension)   . Melanoma [facility])     Past Surgical History:  Procedure Laterality Date  . BICEPS TENDON REPAIR    . TONSILLECTOMY       Current Outpatient Medications  Medication Sig Dispense Refill  . Albuterol Sulfate (PROAIR RESPICLICK) 841 (90 Base) MCG/ACT AEPB Inhale 2 puffs into the lungs every 4 (four) hours. 1 each 1  . Azelastine HCl 0.15 % SOLN Place 2 sprays into both nostrils 2 (two) times daily. 30 mL 5  . Azelastine-Fluticasone (DYMISTA) 137-50 MCG/ACT SUSP One to two sprays each nostril twice a day as needed. 23 g 5  . Beclomethasone Diprop HFA (QVAR REDIHALER) 40 MCG/ACT AERB Inhale 2 puffs into the lungs 2 (two) times daily. 1 Inhaler 3  . fluticasone (FLONASE) 50 MCG/ACT nasal spray Place 1 spray into both nostrils daily.    Marland Kitchen levocetirizine (XYZAL) 5 MG tablet TAKE 1 TABLET BY MOUTH EVERY EVENING 30 tablet 2  .  montelukast (SINGULAIR) 5 MG chewable tablet Chew 5 mg by mouth at bedtime.    . Olopatadine HCl (PATADAY) 0.2 % SOLN Place 1 drop into both eyes 1 day or 1 dose. 1 Bottle 5  . amLODipine (NORVASC) 5 MG tablet Take 1 tablet (5 mg total) by mouth daily. 90 tablet 3  . pravastatin (PRAVACHOL) 40 MG tablet Take 1 tablet (40 mg total) by mouth every evening. 90 tablet 3   No current facility-administered medications for this visit.    Allergies:   Other    ROS:  Please see the history of present illness.   Otherwise, review of systems are positive for ED.   All other systems are reviewed and negative.    PHYSICAL EXAM: VS:  BP 140/74   Pulse 60   Ht 5\' 11"  (1.803 m)   Wt 187 lb 3.2 oz (84.9 kg)   SpO2 96%   BMI 26.11 kg/m  , BMI Body mass index is 26.11 kg/m. GENERAL:  Well appearing NECK:  No jugular venous distention, waveform within normal limits, carotid upstroke brisk and symmetric, no bruits, no thyromegaly LUNGS:  Clear to auscultation bilaterally CHEST:  Unremarkable HEART:  PMI not displaced  or sustained,S1 and S2 within normal limits, no S3, no S4, no clicks, no rubs, 3 out of 6 apical systolic murmur radiating slightly out aortic outflow tract and into the carotids not increasing with the strain phase of Valsalva, no diastolic murmurs ABD:  Flat, positive bowel sounds normal in frequency in pitch, no bruits, no rebound, no guarding, no midline pulsatile mass, no hepatomegaly, no splenomegaly EXT:  2 plus pulses throughout, no edema, no cyanosis no clubbing    EKG:  EKG is not ordered today.    Recent Labs: No results found for requested labs within last 8760 hours.    Lipid Panel No results found for: CHOL, TRIG, HDL, CHOLHDL, VLDL, LDLCALC, LDLDIRECT    Wt Readings from Last 3 Encounters:  06/17/20 187 lb 3.2 oz (84.9 kg)  03/16/20 185 lb (83.9 kg)  09/18/16 184 lb 3.2 oz (83.6 kg)      Other studies Reviewed: Additional studies/ records that were reviewed  today include: Echo, CT. Review of the above records demonstrates:  Please see elsewhere in the note.     ASSESSMENT AND PLAN:  SEPTAL HYPERTROPHY:    He should have an MRI to further evaluate.  Based on these results I will consider genetic testing.  This will help risk stratify for risk of sudden death but based on absence of family history other findings currently he is low risk.   CORONARY CALCIUM: His coronary calcium was 9.59 which is 39th percentile.  He has absolutely no symptoms and he is exercising.  Therefore, no further cardiovascular testing is suggested.  He should continue with risk reduction as below.  HTN:   His blood pressure is not at target.  He wants to try to come off the Hyzaar as he thinks it might be causing some erectile dysfunction so we will stop this and start Norvasc 5 mg daily.  He will keep a blood pressure diary.  DYSLIPIDEMIA: His MESA score is 7.4.  After long discussion with him he would like to try something to get to a goal LDL at least less than 100.  Is currently 133 although his HDL is 59.  I will start pravastatin 40 mg daily.  COVID EDUCATION:   The patient has had his vaccine.    Current medicines are reviewed at length with the patient today.  The patient does not have concerns regarding medicines.  The following changes have been made: As above.    Labs/ tests ordered today include:   Orders Placed This Encounter  Procedures  . MR CARDIAC MORPHOLOGY WO CONTRAST     Disposition:   FU with me in 3 months.     Signed, Minus Breeding, MD  06/17/2020 11:27 AM    [facility]

## 2020-06-17 ENCOUNTER — Other Ambulatory Visit: Payer: Self-pay

## 2020-06-17 ENCOUNTER — Ambulatory Visit: Payer: BC Managed Care – PPO | Admitting: Cardiology

## 2020-06-17 ENCOUNTER — Encounter: Payer: Self-pay | Admitting: Cardiology

## 2020-06-17 VITALS — BP 140/74 | HR 60 | Ht 71.0 in | Wt 187.2 lb

## 2020-06-17 DIAGNOSIS — I10 Essential (primary) hypertension: Secondary | ICD-10-CM | POA: Diagnosis not present

## 2020-06-17 DIAGNOSIS — I517 Cardiomegaly: Secondary | ICD-10-CM

## 2020-06-17 DIAGNOSIS — R931 Abnormal findings on diagnostic imaging of heart and coronary circulation: Secondary | ICD-10-CM

## 2020-06-17 DIAGNOSIS — I422 Other hypertrophic cardiomyopathy: Secondary | ICD-10-CM

## 2020-06-17 MED ORDER — PRAVASTATIN SODIUM 40 MG PO TABS: 40.0000 mg | ORAL_TABLET | Freq: Every evening | ORAL | 3 refills | Status: DC

## 2020-06-17 MED ORDER — AMLODIPINE BESYLATE 5 MG PO TABS: 5.0000 mg | ORAL_TABLET | Freq: Every day | ORAL | 3 refills | Status: DC

## 2020-06-17 NOTE — Patient Instructions (Signed)
Medication Instructions:  Stop Hyzaar (losartan-hydrochlorothiazide) Start Norvasc 5mg  daily Start Pravastatin 40mg  daily *If you need a refill on your cardiac medications before your next appointment, please call your pharmacy*  Lab Work: None ordered at this visit  Testing/Procedures: Your physician has requested that you have a cardiac MRI. Cardiac MRI uses a computer to create images of your heart as its beating, producing both still and moving pictures of your heart and major blood vessels. For further information please visit [email or web address] Please follow the instruction sheet given to you today for more information.  Follow-Up: At [facility], you and your health needs are our priority.  As part of our continuing mission to provide you with exceptional heart care, we have created designated Provider Care Teams.  These Care Teams include your primary Cardiologist (physician) and Advanced Practice Providers (APPs -  Physician Assistants and Nurse Practitioners) who all work together to provide you with the care you need, when you need it.   Your next appointment:   3 month(s)  The format for your next appointment:   In Person  Provider:   Minus Breeding, MD  Other Instructions Cardiac MRI will need to be approved through your insurance and then you will receive a call to schedule testing.

## 2020-06-24 ENCOUNTER — Telehealth: Payer: Self-pay | Admitting: Cardiology

## 2020-06-24 ENCOUNTER — Encounter: Payer: Self-pay | Admitting: Cardiology

## 2020-06-24 NOTE — Telephone Encounter (Signed)
Left message for patient regarding appointment for Cardiac MRI scheduled Monday 07/18/20 at 12:00pm at [facility]-----arrival time is 11:30 am--1st floor admissions office.  Will mail information to patient, it is also in My Chart.  Requested patient to call with questions/concerns.

## 2020-06-28 DIAGNOSIS — M79602 Pain in left arm: Secondary | ICD-10-CM | POA: Diagnosis not present

## 2020-07-14 ENCOUNTER — Other Ambulatory Visit: Payer: BC Managed Care – PPO

## 2020-07-15 ENCOUNTER — Telehealth (HOSPITAL_COMMUNITY): Payer: Self-pay | Admitting: Emergency Medicine

## 2020-07-15 NOTE — Telephone Encounter (Signed)
Attempted to call patient regarding upcoming cardiac MR appointment. Left message on voicemail with name and callback number Sharonlee Nine RN Navigator Cardiac Imaging [facility] [phone] Office [phone] Cell  

## 2020-07-18 ENCOUNTER — Other Ambulatory Visit: Payer: Self-pay

## 2020-07-18 ENCOUNTER — Ambulatory Visit (HOSPITAL_COMMUNITY)
Admission: RE | Admit: 2020-07-18 | Discharge: 2020-07-18 | Disposition: A | Discharge status: Home / Self Care | Payer: BC Managed Care – PPO | Source: Ambulatory Visit | Attending: Cardiology | Admitting: Cardiology

## 2020-07-18 ENCOUNTER — Telehealth (HOSPITAL_COMMUNITY): Payer: Self-pay | Admitting: *Deleted

## 2020-07-18 DIAGNOSIS — I517 Cardiomegaly: Secondary | ICD-10-CM

## 2020-07-18 MED ORDER — GADOBUTROL 1 MMOL/ML IV SOLN
10.0000 mL | Freq: Once | INTRAVENOUS | Status: AC | PRN
  Administered 2020-07-18: 10 mL via INTRAVENOUS

## 2020-07-18 NOTE — Telephone Encounter (Signed)
Returning pt's callregarding upcoming cardiac imaging study; pt verbalizes understanding of appt date/time, parking situation and where to check in, number provided for further questions should they arise  [facility] [phone] office [phone] cell

## 2020-08-05 NOTE — Telephone Encounter (Signed)
Lm to call back ./cy 

## 2020-08-08 NOTE — Telephone Encounter (Signed)
Pt has noticed swelling of the ankles and feet for almost one month that is constant from day to day, worse on the left. Pt also notes that when swelling is at its worst, the skin on the left lower extremity becomes tight feeling, warm and blotchy. Denies itching or weeping. Elevating lower extremities temporarily improves swelling and skin discoloration. Pt notes that the only change to his routine of late is the addition of amlodipine 5 daily and pravastatin 40 mg daily at his last office visit. Per previous comments, denies shortness of breath but reports "slight weight gain." Will forward to Pharm D for review.

## 2020-08-09 NOTE — Telephone Encounter (Signed)
PT HAS APPT W/Dr Hochrein 09/16/2020 @ 10:00 AM

## 2020-08-13 ENCOUNTER — Ambulatory Visit: Payer: BC Managed Care – PPO | Attending: Internal Medicine

## 2020-08-13 DIAGNOSIS — Z23 Encounter for immunization: Secondary | ICD-10-CM

## 2020-08-13 NOTE — Progress Notes (Signed)
   Covid-19 Vaccination Clinic  Name:  MARDY LUCIER    MRN: [id number] DOB: 1961-02-13  08/13/2020  Mr. Micheletti was observed post Covid-19 immunization for 15 minutes without incident. He was provided with Vaccine Information Sheet and instruction to access the V-Safe system.   Mr. Plemons was instructed to call 911 with any severe reactions post vaccine: Marland Kitchen Difficulty breathing  . Swelling of face and throat  . A fast heartbeat  . A bad rash all over body  . Dizziness and weakness

## 2020-09-05 DIAGNOSIS — N410 Acute prostatitis: Secondary | ICD-10-CM | POA: Diagnosis not present

## 2020-09-15 NOTE — Progress Notes (Signed)
Cardiology Office Note   Date:  09/16/2020   ID:  Celene Skeen, DOB 26-Mar-1961, MRN [id number]  PCP:  Aurea Graff.Marlou Sa, MD  Cardiologist:   Minus Breeding, MD   Chief Complaint  Patient presents with  . Leg Swelling      History of Present Illness: Eric Kirk is a 59 y.o. male who was referred by Alroy Dust, L.Marlou Sa, MD for evaluation of an abnormal EKG.   He was found to have left bundle branch block new compared to 2005 EKGs.   I sent him for an echo and he had septal hypertrophy.  He had mild coronary calcium on coronary calcium score.   He had moderate hypertrophy on MRI.  He called recently with ankle swelling.    He called several weeks ago because of increased leg swelling.  It started out both legs.  About a week and a half later the right had resolved to some point.  In two weeks there was still swelling of the left. He called our office and was told to stop the Pravachol.  His ankle swelling eventually went away.  The patient denies any new symptoms such as chest discomfort, neck or arm discomfort. There has been no new shortness of breath, PND or orthopnea. There have been no reported palpitations, presyncope or syncope.      Past Medical History:  Diagnosis Date  . Allergies    Cats  . Chronic low back pain   . Dyslipidemia   . Erectile dysfunction   . HTN (hypertension)   . Melanoma [facility])     Past Surgical History:  Procedure Laterality Date  . BICEPS TENDON REPAIR    . TONSILLECTOMY       Current Outpatient Medications  Medication Sig Dispense Refill  . Albuterol Sulfate (PROAIR RESPICLICK) 630 (90 Base) MCG/ACT AEPB Inhale 2 puffs into the lungs every 4 (four) hours. 1 each 1  . amLODipine (NORVASC) 5 MG tablet Take 1 tablet (5 mg total) by mouth daily. 90 tablet 3  . Azelastine HCl 0.15 % SOLN Place 2 sprays into both nostrils 2 (two) times daily. 30 mL 5  . Beclomethasone Diprop HFA (QVAR REDIHALER) 40 MCG/ACT AERB Inhale 2 puffs into the lungs 2  (two) times daily. 1 Inhaler 3  . fluticasone (FLONASE) 50 MCG/ACT nasal spray Place 1 spray into both nostrils daily.    Marland Kitchen levocetirizine (XYZAL) 5 MG tablet TAKE 1 TABLET BY MOUTH EVERY EVENING 30 tablet 2  . montelukast (SINGULAIR) 5 MG chewable tablet Chew 5 mg by mouth at bedtime.    . Olopatadine HCl (PATADAY) 0.2 % SOLN Place 1 drop into both eyes 1 day or 1 dose. 1 Bottle 5  . Azelastine-Fluticasone (DYMISTA) 137-50 MCG/ACT SUSP One to two sprays each nostril twice a day as needed. 23 g 5  . montelukast (SINGULAIR) 10 MG tablet Take 10 mg by mouth daily. Pt not sure of dose    . pravastatin (PRAVACHOL) 40 MG tablet Take 1 tablet (40 mg total) by mouth every evening. 90 tablet 3   No current facility-administered medications for this visit.    Allergies:   Other and Sulfa antibiotics    ROS:  Please see the history of present illness.   Otherwise, review of systems are positive for none.   All other systems are reviewed and negative.    PHYSICAL EXAM: VS:  BP (!) 162/72   Pulse 64   Ht 5\' 11"  (1.803 m)  Wt 188 lb 9.6 oz (85.5 kg)   BMI 26.30 kg/m  , BMI Body mass index is 26.3 kg/m. GENERAL:  Well appearing NECK:  No jugular venous distention, waveform within normal limits, carotid upstroke brisk and symmetric, positive bruits, no thyromegaly LUNGS:  Clear to auscultation bilaterally CHEST:  Unremarkable HEART:  PMI not displaced or sustained,S1 and S2 within normal limits, no S3, no S4, no clicks, no rubs, 3/6 apical systolic murmur, no diastolic  murmurs ABD:  Flat, positive bowel sounds normal in frequency in pitch, no bruits, no rebound, no guarding, no midline pulsatile mass, no hepatomegaly, no splenomegaly EXT:  2 plus pulses throughout, no edema, no cyanosis no clubbing    EKG:  EKG is  ordered today. NSR, rate 64, LBBB  Recent Labs: No results found for requested labs within last 8760 hours.    Lipid Panel No results found for: CHOL, TRIG, HDL, CHOLHDL,  VLDL, LDLCALC, LDLDIRECT    Wt Readings from Last 3 Encounters:  09/16/20 188 lb 9.6 oz (85.5 kg)  06/17/20 187 lb 3.2 oz (84.9 kg)  03/16/20 185 lb (83.9 kg)      Other studies Reviewed: Additional studies/ records that were reviewed today include:   None Review of the above records demonstrates:  NA   ASSESSMENT AND PLAN:  SEPTAL HYPERTROPHY:   MRI suggested this and there were no high risk findings.  It was thought that this could have been hypertensive cardiomyopathy.   I discussed this with him today.  CORONARY CALCIUM:   I will manage this aggressively with risk reduction.  He has no symptoms despite being very active.  HTN:   His BP is elevated but he is not to keep a blood pressure diary.  He might need further adjustment.  Previously he requested coming off the Hyzaar because he thought it was contributing to erectile dysfunction.   DYSLIPIDEMIA:   His MESA score is 7.4.   I suggested he go back on the pravastatin to see if this has anything to do with the leg swelling.  If he has leg swelling again we will discontinue it.    LEG SWELLING: I would guess this was more related to the amlodipine but he is still on the amlodipine and not having any swelling.  He was traveling around that time and it could have contributed to the leg swelling.  I have asked him to resume the pravastatin as above.  BRUIT: I will get carotid Dopplers.  Current medicines are reviewed at length with the patient today.  The patient does not have concerns regarding medicines.  The following changes have been made: As above.    Labs/ tests ordered today include:   Orders Placed This Encounter  Procedures  . EKG 12-Lead  . VAS US CAROTID     Disposition:   FU with me in 12 months.     Signed, Minus Breeding, MD  09/16/2020 11:05 AM    [place]

## 2020-09-16 ENCOUNTER — Ambulatory Visit: Payer: BC Managed Care – PPO | Admitting: Cardiology

## 2020-09-16 ENCOUNTER — Encounter: Payer: Self-pay | Admitting: Cardiology

## 2020-09-16 VITALS — BP 162/72 | HR 64 | Ht 71.0 in | Wt 188.6 lb

## 2020-09-16 DIAGNOSIS — I422 Other hypertrophic cardiomyopathy: Secondary | ICD-10-CM

## 2020-09-16 DIAGNOSIS — I10 Essential (primary) hypertension: Secondary | ICD-10-CM | POA: Diagnosis not present

## 2020-09-16 DIAGNOSIS — E785 Hyperlipidemia, unspecified: Secondary | ICD-10-CM

## 2020-09-16 DIAGNOSIS — M7989 Other specified soft tissue disorders: Secondary | ICD-10-CM

## 2020-09-16 DIAGNOSIS — R0989 Other specified symptoms and signs involving the circulatory and respiratory systems: Secondary | ICD-10-CM

## 2020-09-16 MED ORDER — PRAVASTATIN SODIUM 40 MG PO TABS: 40.0000 mg | ORAL_TABLET | Freq: Every evening | ORAL | 3 refills | Status: DC

## 2020-09-16 NOTE — Patient Instructions (Signed)
Medication Instructions:  Start Pravastatin 40mg  daily *If you need a refill on your cardiac medications before your next appointment, please call your pharmacy*  Lab Work: None ordered this visit  Testing/Procedures: Your physician has requested that you have a carotid duplex before the end of the year. This test is an ultrasound of the carotid arteries in your neck. It looks at blood flow through these arteries that supply the brain with blood. Allow one hour for this exam. There are no restrictions or special instructions.  Follow-Up: At [facility], you and your health needs are our priority.  As part of our continuing mission to provide you with exceptional heart care, we have created designated Provider Care Teams.  These Care Teams include your primary Cardiologist (physician) and Advanced Practice Providers (APPs -  Physician Assistants and Nurse Practitioners) who all work together to provide you with the care you need, when you need it.  Your next appointment:   12 month(s)  You will receive a reminder letter in the mail two months in advance. If you don't receive a letter, please call our office to schedule the follow-up appointment.  The format for your next appointment:   In Person  Provider:   Minus Breeding, MD

## 2020-10-03 ENCOUNTER — Other Ambulatory Visit: Payer: Self-pay

## 2020-10-03 ENCOUNTER — Ambulatory Visit (HOSPITAL_COMMUNITY)
Admission: RE | Admit: 2020-10-03 | Discharge: 2020-10-03 | Disposition: A | Discharge status: Home / Self Care | Payer: BC Managed Care – PPO | Source: Ambulatory Visit | Attending: Cardiology | Admitting: Cardiology

## 2020-10-03 DIAGNOSIS — R0989 Other specified symptoms and signs involving the circulatory and respiratory systems: Secondary | ICD-10-CM | POA: Diagnosis not present

## 2020-10-14 MED ORDER — LOSARTAN POTASSIUM-HCTZ 50-12.5 MG PO TABS: 1.0000 | ORAL_TABLET | Freq: Two times a day (BID) | ORAL | 3 refills | Status: DC

## 2020-11-28 DIAGNOSIS — D2261 Melanocytic nevi of right upper limb, including shoulder: Secondary | ICD-10-CM | POA: Diagnosis not present

## 2020-11-28 DIAGNOSIS — L814 Other melanin hyperpigmentation: Secondary | ICD-10-CM | POA: Diagnosis not present

## 2020-11-28 DIAGNOSIS — Z85828 Personal history of other malignant neoplasm of skin: Secondary | ICD-10-CM | POA: Diagnosis not present

## 2020-11-28 DIAGNOSIS — D2262 Melanocytic nevi of left upper limb, including shoulder: Secondary | ICD-10-CM | POA: Diagnosis not present

## 2020-12-05 DIAGNOSIS — Z23 Encounter for immunization: Secondary | ICD-10-CM | POA: Diagnosis not present

## 2020-12-05 DIAGNOSIS — Z125 Encounter for screening for malignant neoplasm of prostate: Secondary | ICD-10-CM | POA: Diagnosis not present

## 2020-12-05 DIAGNOSIS — E7800 Pure hypercholesterolemia, unspecified: Secondary | ICD-10-CM | POA: Diagnosis not present

## 2020-12-05 DIAGNOSIS — Z0000 Encounter for general adult medical examination without abnormal findings: Secondary | ICD-10-CM | POA: Diagnosis not present

## 2020-12-08 ENCOUNTER — Other Ambulatory Visit: Payer: BC Managed Care – PPO

## 2020-12-08 DIAGNOSIS — Z20822 Contact with and (suspected) exposure to covid-19: Secondary | ICD-10-CM

## 2020-12-08 DIAGNOSIS — Z1211 Encounter for screening for malignant neoplasm of colon: Secondary | ICD-10-CM | POA: Diagnosis not present

## 2020-12-09 LAB — NOVEL CORONAVIRUS, NAA: SARS-CoV-2, NAA: NOT DETECTED

## 2020-12-09 LAB — SARS-COV-2, NAA 2 DAY TAT

## 2021-01-05 ENCOUNTER — Other Ambulatory Visit: Payer: BC Managed Care – PPO

## 2021-01-05 DIAGNOSIS — Z20822 Contact with and (suspected) exposure to covid-19: Secondary | ICD-10-CM

## 2021-01-06 LAB — SARS-COV-2, NAA 2 DAY TAT

## 2021-01-06 LAB — NOVEL CORONAVIRUS, NAA: SARS-CoV-2, NAA: NOT DETECTED

## 2021-02-13 ENCOUNTER — Other Ambulatory Visit: Payer: Self-pay

## 2021-02-13 ENCOUNTER — Other Ambulatory Visit (HOSPITAL_BASED_OUTPATIENT_CLINIC_OR_DEPARTMENT_OTHER): Payer: Self-pay

## 2021-02-13 ENCOUNTER — Ambulatory Visit: Payer: BC Managed Care – PPO | Attending: Internal Medicine

## 2021-02-13 DIAGNOSIS — Z23 Encounter for immunization: Secondary | ICD-10-CM

## 2021-02-13 MED ORDER — COVID-19 MRNA VACCINE (PFIZER) 30 MCG/0.3ML IM SUSP
INTRAMUSCULAR | 0 refills | Status: DC
  Filled 2021-02-13: qty 0.3, 1d supply, fill #0

## 2021-02-13 NOTE — Progress Notes (Signed)
   Covid-19 Vaccination Clinic  Name:  Eric Kirk    MRN: [id number] DOB: July 08, 1961  02/13/2021  [name] was observed post Covid-19 immunization for 15 minutes without incident. He was provided with Vaccine Information Sheet and instruction to access the V-Safe system.   [name] was instructed to call 911 with any severe reactions post vaccine: Marland Kitchen Difficulty breathing  . Swelling of face and throat  . A fast heartbeat  . A bad rash all over body  . Dizziness and weakness   Immunizations Administered    Name Date Dose VIS Date Route   PFIZER Comrnaty(Gray TOP) Covid-19 Vaccine 02/13/2021 10:21 AM 0.3 mL 10/06/2020 Intramuscular   Manufacturer: [employer]   Lot: [id number]   NDC: [phone]

## 2021-04-11 ENCOUNTER — Other Ambulatory Visit: Payer: Self-pay

## 2021-04-18 IMAGING — MR MR CARD MORPHOLOGY WO/W CM
45 of 48 series · 45 of 48 positions shown · IV contrast (Contrast agent)
Comparison: none

CLINICAL DATA: ?Hypertrophic cardiomyopathy

EXAM:
CARDIAC MRI
TECHNIQUE: The patient was scanned on a 1.5 Tesla GE magnet. A dedicated
cardiac coil was used. Functional imaging was done using Fiesta
sequences. [DATE], and 4 chamber views were done to assess for RWMA's.
Modified Penelas rule using a short axis stack was used to
calculate an ejection fraction on a dedicated work station using
Circle software. The patient received 10 cc of Gadavist. After 10
minutes inversion recovery sequences were used to assess for
infiltration and scar tissue.
CONTRAST:  Gadavist 10 cc

[Series 4: [id number]_db_tra_bh · axial · 8.0mm · 1.56mm/px · 1 of 18 slices shown]
[im 1/18]
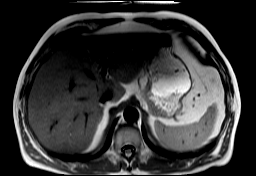

[Series 8: bSSFP · oblique · 8.0mm · 1.61mm/px · 1 of 25 slices shown (1 of 25)]
[im 1/25]
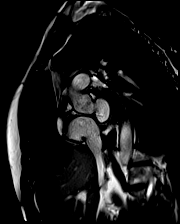

[Series 9: bSSFP · oblique · 8.0mm · 1.61mm/px · 1 of 25 slices shown (2 of 25)]
[im 1/25]
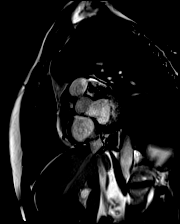

[Series 10: bSSFP · oblique · 8.0mm · 1.61mm/px · 1 of 25 slices shown (3 of 25)]
[im 1/25]
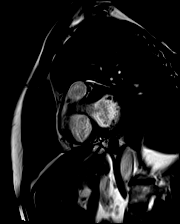

[Series 11: bSSFP · oblique · 8.0mm · 1.61mm/px · 1 of 25 slices shown (4 of 25)]
[im 1/25]
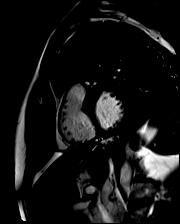

[Series 12: bSSFP · oblique · 8.0mm · 1.61mm/px · 1 of 25 slices shown (5 of 25)]
[im 1/25]
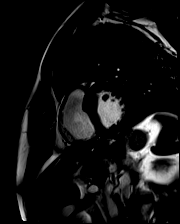

[Series 13: bSSFP · oblique · 8.0mm · 1.61mm/px · 1 of 25 slices shown (6 of 25)]
[im 1/25]
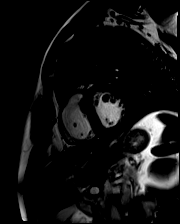

[Series 14: bSSFP · oblique · 8.0mm · 1.61mm/px · 1 of 25 slices shown (7 of 25)]
[im 1/25]
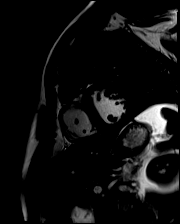

[Series 15: bSSFP · oblique · 8.0mm · 1.61mm/px · 1 of 25 slices shown (8 of 25)]
[im 1/25]
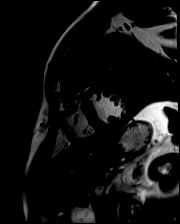

[Series 16: bSSFP · oblique · 8.0mm · 1.61mm/px · 1 of 25 slices shown (9 of 25)]
[im 1/25]
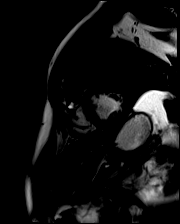

[Series 17: bSSFP · oblique · 8.0mm · 1.61mm/px · 1 of 25 slices shown (10 of 25)]
[im 1/25]
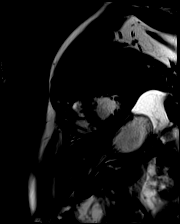

[Series 18: bSSFP · oblique · 8.0mm · 1.61mm/px · 1 of 25 slices shown (11 of 25)]
[im 1/25]
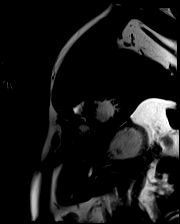

[Series 19: bSSFP · oblique · 8.0mm · 1.61mm/px · 1 of 25 slices shown (12 of 25)]
[im 1/25]
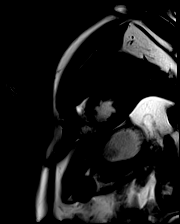

[Series 20: bSSFP · oblique · 8.0mm · 1.61mm/px · 1 of 25 slices shown (13 of 25)]
[im 1/25]
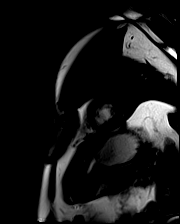

[Series 21: bSSFP · oblique · 8.0mm · 1.61mm/px · 1 of 25 slices shown (14 of 25)]
[im 1/25]
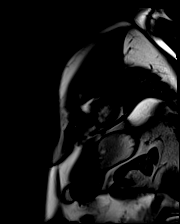

[Series 22: bSSFP · oblique · 8.0mm · 1.61mm/px · 1 of 25 slices shown (15 of 25)]
[im 1/25]
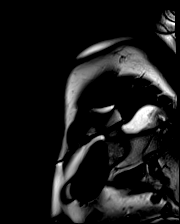

[Series 23: bSSFP · oblique · 8.0mm · 1.61mm/px · 1 of 25 slices shown (16 of 25)]
[im 1/25]
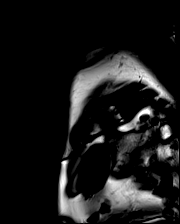

[Series 24: bSSFP · oblique · 8.0mm · 1.61mm/px · 1 of 25 slices shown (17 of 25)]
[im 1/25]
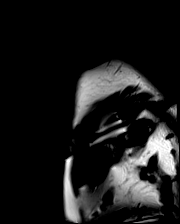

[Series 25: bSSFP · axial · 6.0mm · 1.41mm/px · 1 of 25 slices shown (18 of 25)]
[im 1/25]
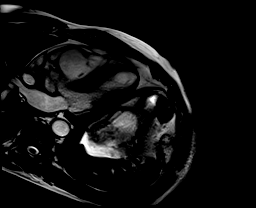

[Series 26: bSSFP · axial · 6.0mm · 1.41mm/px · 1 of 25 slices shown (19 of 25)]
[im 1/25]
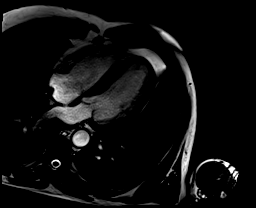

[Series 27: bSSFP · oblique · 6.0mm · 1.41mm/px · 1 of 25 slices shown (20 of 25)]
[im 1/25]
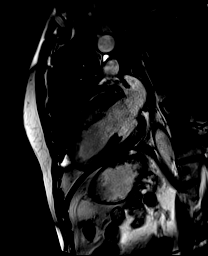

[Series 28: (id)_long_t1 · oblique · 8.0mm · 1.41mm/px · 1 of 24 slices shown]
[im 1/24]
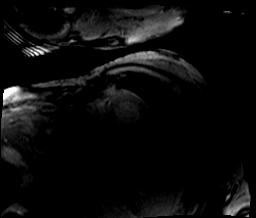

[Series 29: (id)_long_t1_moco · oblique · 8.0mm · 1.41mm/px · 1 of 24 slices shown]
[im 1/24]
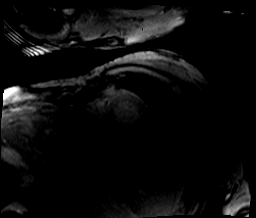

[Series 32: (id)_trufi · oblique · 8.0mm · 1.88mm/px · 1 of 9 slices shown]
[im 1/9]
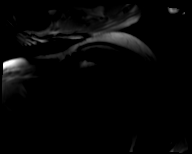

[Series 33: (id)_trufi_moco · oblique · 8.0mm · 1.88mm/px · 1 of 9 slices shown]
[im 1/9]
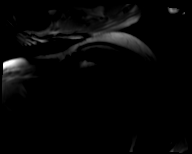

[Series 36: cor rvot · oblique · 6.0mm · 1.41mm/px · 1 of 25 slices shown]
[im 1/25]
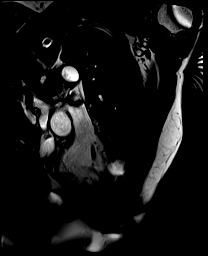

[Series 37: bSSFP · coronal · 6.0mm · 1.41mm/px · 1 of 16 slices shown (21 of 25)]
[im 1/16]
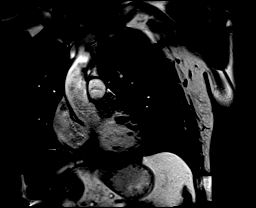

[Series 38: flow_100_tp_retro_bh · oblique · 6.0mm · 1.73mm/px · 1 of 30 slices shown (1 of 2)]
[im 1/30]
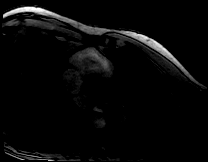

[Series 39: flow_100_tp_retro_bh_mag · oblique · 6.0mm · 1.73mm/px · 1 of 30 slices shown (1 of 2)]
[im 1/30]
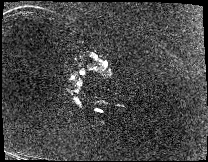

[Series 40: flow_100_tp_retro_bh_p · oblique · 6.0mm · 1.73mm/px · 1 of 30 slices shown (1 of 2)]
[im 1/30]
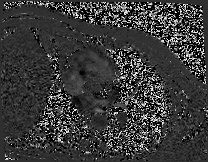

[Series 41: flow_100_tp_retro_bh · oblique · 6.0mm · 1.73mm/px · 1 of 30 slices shown (2 of 2)]
[im 1/30]
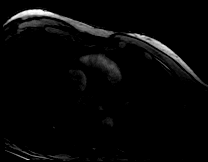

[Series 42: flow_100_tp_retro_bh_mag · oblique · 6.0mm · 1.73mm/px · 1 of 29 slices shown (2 of 2)]
[im 1/29]
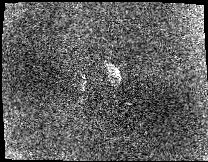

[Series 43: flow_100_tp_retro_bh_p · oblique · 6.0mm · 1.73mm/px · 1 of 30 slices shown (2 of 2)]
[im 1/30]
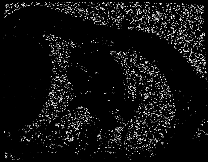

[Series 45: lge_single shot sa · oblique · 8.0mm · 1.98mm/px · 1 of 18 slices shown (1 of 4)]
[im 1/18]
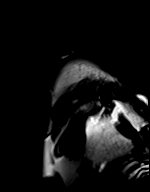

[Series 46: lge_single shot sa · oblique · 8.0mm · 1.98mm/px · 1 of 18 slices shown (2 of 4)]
[im 1/18]
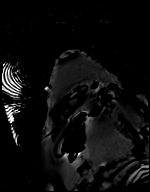

[Series 49: lge_single shot 4 · axial · 6.0mm · 1.98mm/px · 1 of 1 slices shown (1 of 2)]
[im 1/1]
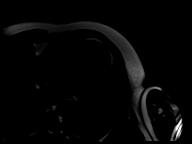

[Series 50: lge_single shot 4 · axial · 6.0mm · 1.98mm/px · 1 of 1 slices shown (2 of 2)]
[im 1/1]
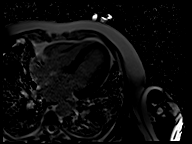

[Series 51: lge_single shot 3 · axial · 6.0mm · 1.98mm/px · 1 of 1 slices shown (1 of 2)]
[im 1/1]
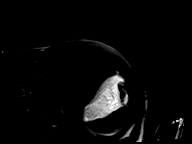

[Series 52: lge_single shot 3 · axial · 6.0mm · 1.98mm/px · 1 of 1 slices shown (2 of 2)]
[im 1/1]
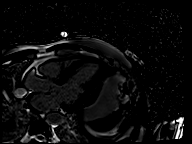

[Series 53: lge_single shot sa · oblique · 8.0mm · 1.98mm/px · 1 of 18 slices shown (3 of 4)]
[im 1/18]
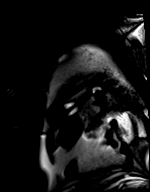

[Series 54: lge_single shot sa · oblique · 8.0mm · 1.98mm/px · 1 of 18 slices shown (4 of 4)]
[im 1/18]
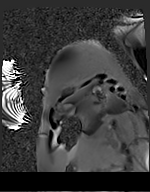

[Series 56: bSSFP · oblique · 8.0mm · 1.92mm/px · 1 of 4 slices shown (22 of 25)]
[im 1/4]
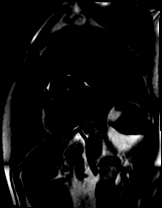

[Series 57: bSSFP · oblique · 8.0mm · 1.92mm/px · 1 of 4 slices shown (23 of 25)]
[im 1/4]
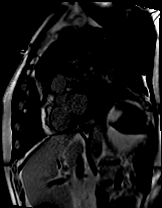

[Series 65: bSSFP · axial · 6.0mm · 1.92mm/px · 1 of 1 slices shown (24 of 25)]
[im 1/1]
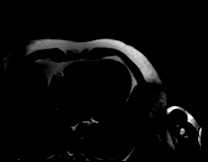

[Series 66: bSSFP · axial · 6.0mm · 1.92mm/px · 1 of 1 slices shown (25 of 25)]
[im 1/1]
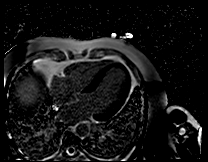

[45 of 48 positions shown; findings below may reference images not displayed]

FINDINGS: Limited images of the lung fields showed no gross abnormalities.

Normal left ventricular size with moderate focal basal septal
hypertrophy (16 mm basal anteroseptum, 12 mm basal inferolateral
wall). There was septal-lateral dyssynchrony consistent with LBBB,
otherwise no significant wall motion abnormalities with LV EF 57%.
Normal right ventricular size with low normal systolic function, EF
44%. Normal left and right atrial sizes. Trileaflet aortic valve, no
significant stenosis or regurgitation. No significant mitral
regurgitation noted. There does appear to be mild systolic anterior
motion of the mitral valve (without significant regurgitation or
evidence for significant LV outflow gradient).

On delayed enhancement images, there was no definite myocardial late
gadolinium enhancement (LGE).

Measurements:

LVEDV 192 mL
LVSV 109 mL
LVEF 57%

RVEDV 145 mL
RVSV 54 mL
RVEF 44%

ECV 31% (No HCT available in system so assume HCT 42%)
IMPRESSION: 1. Normal LV size with moderate focal basal septal hypertrophy, LV
EF 57%. Septal-lateral dyssynchrony c/w LBBB.

2.  Normal RV size with low normal systolic function, EF 44%.

3. Mild mitral valve MIMIKIE with no evidence for significant MR or LVOT
gradient.

4. No myocardial LGE, so no definitive evidence for prior MI,
infiltrative disease, or myocarditis.

5.  Mildly elevated ECV percentage, nonspecific.

This study could be consistent with a more benign hypertrophic
cardiomyopathy variant versus hypertensive cardiomyopathy.

Quirijn Amazigh

## 2021-06-15 DIAGNOSIS — I10 Essential (primary) hypertension: Secondary | ICD-10-CM | POA: Diagnosis not present

## 2021-06-15 DIAGNOSIS — L309 Dermatitis, unspecified: Secondary | ICD-10-CM | POA: Diagnosis not present

## 2021-06-15 DIAGNOSIS — Z23 Encounter for immunization: Secondary | ICD-10-CM | POA: Diagnosis not present

## 2021-06-15 DIAGNOSIS — J309 Allergic rhinitis, unspecified: Secondary | ICD-10-CM | POA: Diagnosis not present

## 2021-06-15 DIAGNOSIS — E7800 Pure hypercholesterolemia, unspecified: Secondary | ICD-10-CM | POA: Diagnosis not present

## 2021-08-01 DIAGNOSIS — C4441 Basal cell carcinoma of skin of scalp and neck: Secondary | ICD-10-CM | POA: Diagnosis not present

## 2021-08-23 ENCOUNTER — Other Ambulatory Visit: Payer: Self-pay | Admitting: Cardiology

## 2021-09-10 NOTE — Progress Notes (Addendum)
Cardiology Office Note   Date:  10/03/2021   ID:  Eric Kirk, DOB Mar 25, 1961, MRN [id number]  PCP:  Aurea Graff.Marlou Sa, MD  Cardiologist:   Minus Breeding, MD   Chief Complaint  Patient presents with   Septal Hypertrophy       History of Present Illness: Eric Kirk is a 60 y.o. male who was referred by Alroy Dust, L.Marlou Sa, MD for evaluation of an abnormal EKG.   He was found to have left bundle branch block new compared to 2005 EKGs.   I sent him for an echo and he had septal hypertrophy.  He had mild coronary calcium on coronary calcium score.   He had moderate hypertrophy on MRI.   He has done quite well.  He travels quite a bit for business.  He has not had any palpitations or shortness of breath.  He exercises routinely.   Past Medical History:  Diagnosis Date   Allergies    Cats   Chronic low back pain    Dyslipidemia    Erectile dysfunction    HTN (hypertension)    Melanoma (HCC)     Past Surgical History:  Procedure Laterality Date   BICEPS TENDON REPAIR     TONSILLECTOMY       Current Outpatient Medications  Medication Sig Dispense Refill   Albuterol Sulfate (PROAIR RESPICLICK) 094 (90 Base) MCG/ACT AEPB Inhale 2 puffs into the lungs every 4 (four) hours. 1 each 1   Azelastine HCl 0.15 % SOLN Place 2 sprays into both nostrils 2 (two) times daily. 30 mL 5   Azelastine-Fluticasone (DYMISTA) 137-50 MCG/ACT SUSP One to two sprays each nostril twice a day as needed. 23 g 5   Beclomethasone Diprop HFA (QVAR REDIHALER) 40 MCG/ACT AERB Inhale 2 puffs into the lungs 2 (two) times daily. 1 Inhaler 3   COVID-19 mRNA vaccine, Pfizer, 30 MCG/0.3ML injection Inject into the muscle. 0.3 mL 0   fluticasone (FLONASE) 50 MCG/ACT nasal spray Place 1 spray into both nostrils daily.     levocetirizine (XYZAL) 5 MG tablet TAKE 1 TABLET BY MOUTH EVERY EVENING 30 tablet 2   losartan-hydrochlorothiazide (HYZAAR) 50-12.5 MG tablet Take 1 tablet by mouth in the morning and at  bedtime. KEEP OV. 180 tablet 0   montelukast (SINGULAIR) 10 MG tablet Take 10 mg by mouth daily. Pt not sure of dose     montelukast (SINGULAIR) 5 MG chewable tablet Chew 5 mg by mouth at bedtime.     Olopatadine HCl (PATADAY) 0.2 % SOLN Place 1 drop into both eyes 1 day or 1 dose. 1 Bottle 5   pravastatin (PRAVACHOL) 40 MG tablet Take 1 tablet (40 mg total) by mouth every evening. 90 tablet 3   No current facility-administered medications for this visit.    Allergies:   Other, Amlodipine besylate, Mite (d. farinae), and Sulfa antibiotics    ROS:  Please see the history of present illness.   Otherwise, review of systems are positive for none.   All other systems are reviewed and negative.    PHYSICAL EXAM: VS:  BP (!) 158/82   Pulse (!) 57   Ht 5\' 11"  (1.803 m)   Wt 193 lb 3.2 oz (87.6 kg)   SpO2 95%   BMI 26.95 kg/m  , BMI Body mass index is 26.95 kg/m. GENERAL:  Well appearing NECK:  No jugular venous distention, waveform within normal limits, carotid upstroke brisk and symmetric, no bruits, no thyromegaly LUNGS:  Clear  to auscultation bilaterally CHEST:  Unremarkable HEART:  PMI not displaced or sustained,S1 and S2 within normal limits, no S3, no S4, no clicks, no rubs, 2 out of 6 brief apical systolic murmur nonradiating and not increasing with the strain phase of Valsalva murmurs ABD:  Flat, positive bowel sounds normal in frequency in pitch, no bruits, no rebound, no guarding, no midline pulsatile mass, no hepatomegaly, no splenomegaly EXT:  2 plus pulses throughout, no edema, no cyanosis no clubbing   EKG:  EKG is  ordered today. NSR, rate 57, LBBB  Recent Labs: No results found for requested labs within last 8760 hours.    Lipid Panel No results found for: CHOL, TRIG, HDL, CHOLHDL, VLDL, LDLCALC, LDLDIRECT    Wt Readings from Last 3 Encounters:  09/11/21 193 lb 3.2 oz (87.6 kg)  09/16/20 188 lb 9.6 oz (85.5 kg)  06/17/20 187 lb 3.2 oz (84.9 kg)      Other  studies Reviewed: Additional studies/ records that were reviewed today include:   Labs Review of the above records demonstrates:  See elsewhere   ASSESSMENT AND PLAN:  SEPTAL HYPERTROPHY:    This was evident on MRI.  There are no other high risk findings.  I will follow this clinically.  I did talk to him about his children possibly getting an MRI.  CORONARY CALCIUM:   He has very mild calcium and he will continue with risk reduction.  HTN:   His BP elevated but this is unusual and he is going to keep a blood pressure diary.   DYSLIPIDEMIA:   His MESA score was  7.4.  No change in therapy.    LBBB:  Chronic.    Current medicines are reviewed at length with the patient today.  The patient does not have concerns regarding medicines.  The following changes have been made:  None.    Labs/ tests ordered today include:   None   Orders Placed This Encounter  Procedures   EKG 12-Lead      Disposition:   FU with me in 12 months.     Signed, Minus Breeding, MD  10/03/2021 9:28 PM    [place]

## 2021-09-11 ENCOUNTER — Other Ambulatory Visit: Payer: Self-pay

## 2021-09-11 ENCOUNTER — Encounter: Payer: Self-pay | Admitting: Cardiology

## 2021-09-11 ENCOUNTER — Ambulatory Visit: Payer: BC Managed Care – PPO | Admitting: Cardiology

## 2021-09-11 VITALS — BP 158/82 | HR 57 | Ht 71.0 in | Wt 193.2 lb

## 2021-09-11 DIAGNOSIS — R931 Abnormal findings on diagnostic imaging of heart and coronary circulation: Secondary | ICD-10-CM

## 2021-09-11 DIAGNOSIS — I10 Essential (primary) hypertension: Secondary | ICD-10-CM | POA: Diagnosis not present

## 2021-09-11 DIAGNOSIS — E785 Hyperlipidemia, unspecified: Secondary | ICD-10-CM

## 2021-09-11 DIAGNOSIS — I517 Cardiomegaly: Secondary | ICD-10-CM

## 2021-09-11 DIAGNOSIS — M7989 Other specified soft tissue disorders: Secondary | ICD-10-CM

## 2021-09-11 NOTE — Patient Instructions (Signed)
Medication Instructions:  The current medical regimen is effective;  continue present plan and medications as directed. Please refer to the Current Medication list given to you today.  *If you need a refill on your cardiac medications before your next appointment, please call your pharmacy*  Lab Work:   Testing/Procedures:  NONE    NONE  Special Instructions NONE  Follow-Up: Your next appointment:  12 month(s) In Person with Minus Breeding, MD  or Rosaria Ferries, PA-C, Coletta Memos, FNP, Fabian Sharp, PA-C, Sande Rives, PA-C, Vikki Ports, PA-C, Caron Presume, PA-C, Jory Sims, DNP, ANP, Almyra Deforest, PA-C, or Diona Browner, NP      Please call our office 2 months in advance to schedule this appointment   At [facility], you and your health needs are our priority.  As part of our continuing mission to provide you with exceptional heart care, we have created designated Provider Care Teams.  These Care Teams include your primary Cardiologist (physician) and Advanced Practice Providers (APPs -  Physician Assistants and Nurse Practitioners) who all work together to provide you with the care you need, when you need it.

## 2021-09-12 DIAGNOSIS — C44319 Basal cell carcinoma of skin of other parts of face: Secondary | ICD-10-CM | POA: Diagnosis not present

## 2021-10-03 ENCOUNTER — Encounter: Payer: Self-pay | Admitting: Cardiology

## 2021-10-03 DIAGNOSIS — I10 Essential (primary) hypertension: Secondary | ICD-10-CM

## 2021-10-04 MED ORDER — LOSARTAN POTASSIUM-HCTZ 100-25 MG PO TABS: 1.0000 | ORAL_TABLET | Freq: Every day | ORAL | 1 refills | Status: AC

## 2021-11-06 DIAGNOSIS — I10 Essential (primary) hypertension: Secondary | ICD-10-CM | POA: Diagnosis not present

## 2021-11-07 LAB — BASIC METABOLIC PANEL
BUN/Creatinine Ratio: 13 (ref 10–24)
BUN: 12 mg/dL (ref 8–27)
CO2: 24 mmol/L (ref 20–29)
Calcium: 10 mg/dL (ref 8.6–10.2)
Chloride: 99 mmol/L (ref 96–106)
Creatinine, Ser: 0.96 mg/dL (ref 0.76–1.27)
Glucose: 104 mg/dL — ABNORMAL HIGH (ref 70–99)
Potassium: 4.5 mmol/L (ref 3.5–5.2)
Sodium: 140 mmol/L (ref 134–144)
eGFR: 90 mL/min/{1.73_m2} (ref >59)

## 2021-11-19 ENCOUNTER — Other Ambulatory Visit: Payer: Self-pay | Admitting: Cardiology

## 2021-12-14 DIAGNOSIS — I10 Essential (primary) hypertension: Secondary | ICD-10-CM | POA: Diagnosis not present

## 2021-12-14 DIAGNOSIS — Z125 Encounter for screening for malignant neoplasm of prostate: Secondary | ICD-10-CM | POA: Diagnosis not present

## 2021-12-14 DIAGNOSIS — E7800 Pure hypercholesterolemia, unspecified: Secondary | ICD-10-CM | POA: Diagnosis not present

## 2021-12-14 DIAGNOSIS — Z0000 Encounter for general adult medical examination without abnormal findings: Secondary | ICD-10-CM | POA: Diagnosis not present

## 2022-02-23 DIAGNOSIS — C44519 Basal cell carcinoma of skin of other part of trunk: Secondary | ICD-10-CM | POA: Diagnosis not present

## 2022-02-23 DIAGNOSIS — D2362 Other benign neoplasm of skin of left upper limb, including shoulder: Secondary | ICD-10-CM | POA: Diagnosis not present

## 2022-02-23 DIAGNOSIS — L570 Actinic keratosis: Secondary | ICD-10-CM | POA: Diagnosis not present

## 2022-02-23 DIAGNOSIS — L738 Other specified follicular disorders: Secondary | ICD-10-CM | POA: Diagnosis not present

## 2022-02-23 DIAGNOSIS — Z85828 Personal history of other malignant neoplasm of skin: Secondary | ICD-10-CM | POA: Diagnosis not present

## 2022-06-14 DIAGNOSIS — J309 Allergic rhinitis, unspecified: Secondary | ICD-10-CM | POA: Diagnosis not present

## 2022-06-14 DIAGNOSIS — E7800 Pure hypercholesterolemia, unspecified: Secondary | ICD-10-CM | POA: Diagnosis not present

## 2022-06-14 DIAGNOSIS — I10 Essential (primary) hypertension: Secondary | ICD-10-CM | POA: Diagnosis not present

## 2022-06-14 DIAGNOSIS — L309 Dermatitis, unspecified: Secondary | ICD-10-CM | POA: Diagnosis not present

## 2022-07-17 DIAGNOSIS — R739 Hyperglycemia, unspecified: Secondary | ICD-10-CM | POA: Diagnosis not present

## 2022-07-25 DIAGNOSIS — K649 Unspecified hemorrhoids: Secondary | ICD-10-CM | POA: Diagnosis not present

## 2022-07-25 DIAGNOSIS — Z8371 Family history of colonic polyps: Secondary | ICD-10-CM | POA: Diagnosis not present

## 2022-07-25 DIAGNOSIS — Z1211 Encounter for screening for malignant neoplasm of colon: Secondary | ICD-10-CM | POA: Diagnosis not present

## 2022-07-25 DIAGNOSIS — D122 Benign neoplasm of ascending colon: Secondary | ICD-10-CM | POA: Diagnosis not present

## 2022-07-25 DIAGNOSIS — K5730 Diverticulosis of large intestine without perforation or abscess without bleeding: Secondary | ICD-10-CM | POA: Diagnosis not present

## 2022-08-13 ENCOUNTER — Other Ambulatory Visit (HOSPITAL_BASED_OUTPATIENT_CLINIC_OR_DEPARTMENT_OTHER): Payer: Self-pay

## 2022-08-13 MED ORDER — COVID-19 MRNA 2023-2024 VACCINE (COMIRNATY) 0.3 ML INJECTION
INTRAMUSCULAR | 0 refills | Status: DC
  Filled 2022-08-13: qty 0.3, 1d supply, fill #0

## 2022-08-13 MED ORDER — INFLUENZA VAC SPLIT QUAD 0.5 ML IM SUSY
PREFILLED_SYRINGE | INTRAMUSCULAR | 0 refills | Status: DC
  Filled 2022-08-13: qty 0.5, 1d supply, fill #0

## 2022-09-18 ENCOUNTER — Ambulatory Visit: Payer: BC Managed Care – PPO | Attending: Cardiology | Admitting: Cardiology

## 2022-09-18 ENCOUNTER — Encounter: Payer: Self-pay | Admitting: Cardiology

## 2022-09-18 VITALS — BP 132/68 | HR 57 | Ht 70.5 in | Wt 191.4 lb

## 2022-09-18 DIAGNOSIS — I517 Cardiomegaly: Secondary | ICD-10-CM | POA: Diagnosis not present

## 2022-09-18 NOTE — Patient Instructions (Signed)
Medication Instructions:  Your physician recommends that you continue on your current medications as directed. Please refer to the Current Medication list given to you today.  *If you need a refill on your cardiac medications before your next appointment, please call your pharmacy*  Testing/Procedures: Your physician has requested that you have an echocardiogram in SEPTEMBER 2024. Echocardiography is a painless test that uses sound waves to create images of your heart. It provides your doctor with information about the size and shape of your heart and how well your heart's chambers and valves are working. This procedure takes approximately one hour. There are no restrictions for this procedure. Please do NOT wear cologne, perfume, aftershave, or lotions (deodorant is allowed). Please arrive 15 minutes prior to your appointment time.  Follow-Up: At [facility], you and your health needs are our priority.  As part of our continuing mission to provide you with exceptional heart care, we have created designated Provider Care Teams.  These Care Teams include your primary Cardiologist (physician) and Advanced Practice Providers (APPs -  Physician Assistants and Nurse Practitioners) who all work together to provide you with the care you need, when you need it.  We recommend signing up for the patient portal called "MyChart".  Sign up information is provided on this After Visit Summary.  MyChart is used to connect with patients for Virtual Visits (Telemedicine).  Patients are able to view lab/test results, encounter notes, upcoming appointments, etc.  Non-urgent messages can be sent to your provider as well.   To learn more about what you can do with MyChart, go to [email or web address].    Your next appointment:   12 month(s)  The format for your next appointment:   In Person  Provider:   Minus Breeding, MD

## 2022-09-18 NOTE — Progress Notes (Signed)
Cardiology Office Note   Date:  09/18/2022   ID:  Eric Kirk, DOB 1961/08/22, MRN [id number]  PCP:  Eric Graff.Marlou Sa, MD  Cardiologist:   Minus Breeding, MD   Chief Complaint  Patient presents with   Cardiomyopathy     History of Present Illness: Eric Kirk is a 61 y.o. male who was referred by Eric Kirk, L.Marlou Sa, MD for evaluation of an abnormal EKG.   He was found to have left bundle branch block new compared to 2005 EKGs.   I sent him for an echo and he had septal hypertrophy.  He had mild coronary calcium on coronary calcium score.   He had moderate hypertrophy on MRI.   Since I last saw him he has done well.  The patient denies any new symptoms such as chest discomfort, neck or arm discomfort. There has been no new shortness of breath, PND or orthopnea. There have been no reported palpitations, presyncope or syncope.  He exercises routinely and vigorously.    Past Medical History:  Diagnosis Date   Allergies    Cats   Chronic low back pain    Dyslipidemia    Erectile dysfunction    HTN (hypertension)    Melanoma (HCC)     Past Surgical History:  Procedure Laterality Date   BICEPS TENDON REPAIR     TONSILLECTOMY       Current Outpatient Medications  Medication Sig Dispense Refill   Albuterol Sulfate (PROAIR RESPICLICK) 010 (90 Base) MCG/ACT AEPB Inhale 2 puffs into the lungs every 4 (four) hours. 1 each 1   Azelastine HCl 0.15 % SOLN Place 2 sprays into both nostrils 2 (two) times daily. 30 mL 5   Beclomethasone Diprop HFA (QVAR REDIHALER) 40 MCG/ACT AERB Inhale 2 puffs into the lungs 2 (two) times daily. 1 Inhaler 3   fluticasone (FLONASE) 50 MCG/ACT nasal spray Place 1 spray into both nostrils daily.     levocetirizine (XYZAL) 5 MG tablet TAKE 1 TABLET BY MOUTH EVERY EVENING 30 tablet 2   losartan-hydrochlorothiazide (HYZAAR) 100-25 MG tablet Take 1 tablet by mouth daily. 90 tablet 1   montelukast (SINGULAIR) 10 MG tablet Take 10 mg by mouth daily. Pt not  sure of dose     montelukast (SINGULAIR) 5 MG chewable tablet Chew 5 mg by mouth at bedtime.     Olopatadine HCl (PATADAY) 0.2 % SOLN Place 1 drop into both eyes 1 day or 1 dose. 1 Bottle 5   pravastatin (PRAVACHOL) 40 MG tablet TAKE 1 TABLET BY MOUTH EVERY DAY IN THE EVENING 90 tablet 3   Azelastine-Fluticasone (DYMISTA) 137-50 MCG/ACT SUSP One to two sprays each nostril twice a day as needed. 23 g 5   COVID-19 mRNA vaccine 2023-2024 (COMIRNATY) SUSP injection Inject into the muscle. (Patient not taking: Reported on 09/18/2022) 0.3 mL 0   COVID-19 mRNA vaccine, Pfizer, 30 MCG/0.3ML injection Inject into the muscle. (Patient not taking: Reported on 09/18/2022) 0.3 mL 0   influenza vac split quadrivalent PF (FLUARIX) 0.5 ML injection Inject into the muscle. (Patient not taking: Reported on 09/18/2022) 0.5 mL 0   No current facility-administered medications for this visit.    Allergies:   Other, Amlodipine besylate, Mite (d. farinae), and Sulfa antibiotics    ROS:  Please see the history of present illness.   Otherwise, review of systems are positive for none.   All other systems are reviewed and negative.    PHYSICAL EXAM: VS:  BP 132/68 (BP  Location: Left Arm, Patient Position: Sitting, Cuff Size: Large)   Pulse (!) 57   Ht 5' 10.5" (1.791 m)   Wt 191 lb 6.4 oz (86.8 kg)   PF 95 L/min   BMI 27.07 kg/m  , BMI Body mass index is 27.07 kg/m. GENERAL:  Well appearing NECK:  No jugular venous distention, waveform within normal limits, carotid upstroke brisk and symmetric, no bruits, no thyromegaly LUNGS:  Clear to auscultation bilaterally CHEST:  Unremarkable HEART:  PMI not displaced or sustained,S1 and S2 within normal limits, no S3, no S4, no clicks, no rubs, systolic murmur radiating out the outflow tract, no diastolic murmurs ABD:  Flat, positive bowel sounds normal in frequency in pitch, no bruits, no rebound, no guarding, no midline pulsatile mass, no hepatomegaly, no  splenomegaly EXT:  2 plus pulses throughout, no edema, no cyanosis no clubbing  EKG:  EKG is  ordered today. NSR, rate 57, LBBB  Recent Labs: 11/06/2021: BUN 12; Creatinine, Ser 0.96; Potassium 4.5; Sodium 140    Lipid Panel No results found for: "CHOL", "TRIG", "HDL", "CHOLHDL", "VLDL", "LDLCALC", "LDLDIRECT"    Wt Readings from Last 3 Encounters:  09/18/22 191 lb 6.4 oz (86.8 kg)  09/11/21 193 lb 3.2 oz (87.6 kg)  09/16/20 188 lb 9.6 oz (85.5 kg)      Other studies Reviewed: Additional studies/ records that were reviewed today include:   Labs Review of the above records demonstrates:  See elsewhere   ASSESSMENT AND PLAN:  SEPTAL HYPERTROPHY:      I will repeat an echo in Sept.  He has no symptoms related to this.  MRI was not diagnostic in 2021.    CORONARY CALCIUM:   He has very mild calcium and he will continue with risk reduction.   HTN:   His BP elevated but this is unusual and he is going to keep a blood pressure diary.   DYSLIPIDEMIA:   His MESA score was  7.4.   LDL was 108.  I will increase the pravastatin 80 mg PO daily.    LBBB:  Chronic.    Current medicines are reviewed at length with the patient today.  The patient does not have concerns regarding medicines.  The following changes have been made:  None.    Labs/ tests ordered today include:   None   Orders Placed This Encounter  Procedures   ECHOCARDIOGRAM COMPLETE    Disposition:   FU with me in 12 months.     Signed, Minus Breeding, MD  09/18/2022 5:48 PM    [facility]

## 2022-09-19 ENCOUNTER — Encounter: Payer: Self-pay | Admitting: Cardiology

## 2022-09-19 MED ORDER — PRAVASTATIN SODIUM 80 MG PO TABS: 80.0000 mg | ORAL_TABLET | Freq: Every day | ORAL | 3 refills | Status: DC

## 2022-09-26 NOTE — Addendum Note (Signed)
Addended by: Ellwood Handler on: 42/07/3127 03:40 PM   Modules accepted: Orders

## 2022-10-21 ENCOUNTER — Ambulatory Visit (HOSPITAL_COMMUNITY): Payer: BC Managed Care – PPO

## 2022-11-02 ENCOUNTER — Other Ambulatory Visit: Payer: Self-pay

## 2022-11-02 MED ORDER — AREXVY 120 MCG/0.5ML IM SUSR
INTRAMUSCULAR | 0 refills | Status: AC
  Filled 2022-11-02: qty 1, 1d supply, fill #0

## 2022-12-17 DIAGNOSIS — Z0000 Encounter for general adult medical examination without abnormal findings: Secondary | ICD-10-CM | POA: Diagnosis not present

## 2022-12-17 DIAGNOSIS — E7800 Pure hypercholesterolemia, unspecified: Secondary | ICD-10-CM | POA: Diagnosis not present

## 2022-12-17 DIAGNOSIS — Z125 Encounter for screening for malignant neoplasm of prostate: Secondary | ICD-10-CM | POA: Diagnosis not present

## 2022-12-17 DIAGNOSIS — Z23 Encounter for immunization: Secondary | ICD-10-CM | POA: Diagnosis not present

## 2022-12-17 DIAGNOSIS — I10 Essential (primary) hypertension: Secondary | ICD-10-CM | POA: Diagnosis not present

## 2023-02-20 DIAGNOSIS — N3943 Post-void dribbling: Secondary | ICD-10-CM | POA: Diagnosis not present

## 2023-02-20 DIAGNOSIS — N5201 Erectile dysfunction due to arterial insufficiency: Secondary | ICD-10-CM | POA: Diagnosis not present

## 2023-02-20 DIAGNOSIS — R3912 Poor urinary stream: Secondary | ICD-10-CM | POA: Diagnosis not present

## 2023-02-20 DIAGNOSIS — R972 Elevated prostate specific antigen [PSA]: Secondary | ICD-10-CM | POA: Diagnosis not present

## 2023-02-20 DIAGNOSIS — R3916 Straining to void: Secondary | ICD-10-CM | POA: Diagnosis not present

## 2023-02-20 DIAGNOSIS — N401 Enlarged prostate with lower urinary tract symptoms: Secondary | ICD-10-CM | POA: Diagnosis not present

## 2023-02-27 DIAGNOSIS — D224 Melanocytic nevi of scalp and neck: Secondary | ICD-10-CM | POA: Diagnosis not present

## 2023-02-27 DIAGNOSIS — Z85828 Personal history of other malignant neoplasm of skin: Secondary | ICD-10-CM | POA: Diagnosis not present

## 2023-02-27 DIAGNOSIS — C44519 Basal cell carcinoma of skin of other part of trunk: Secondary | ICD-10-CM | POA: Diagnosis not present

## 2023-02-27 DIAGNOSIS — D2261 Melanocytic nevi of right upper limb, including shoulder: Secondary | ICD-10-CM | POA: Diagnosis not present

## 2023-02-27 DIAGNOSIS — D2362 Other benign neoplasm of skin of left upper limb, including shoulder: Secondary | ICD-10-CM | POA: Diagnosis not present

## 2023-02-27 DIAGNOSIS — C44712 Basal cell carcinoma of skin of right lower limb, including hip: Secondary | ICD-10-CM | POA: Diagnosis not present

## 2023-03-13 DIAGNOSIS — L720 Epidermal cyst: Secondary | ICD-10-CM | POA: Diagnosis not present

## 2023-04-17 DIAGNOSIS — R972 Elevated prostate specific antigen [PSA]: Secondary | ICD-10-CM | POA: Diagnosis not present

## 2023-04-17 DIAGNOSIS — N5201 Erectile dysfunction due to arterial insufficiency: Secondary | ICD-10-CM | POA: Diagnosis not present

## 2023-04-17 DIAGNOSIS — R3912 Poor urinary stream: Secondary | ICD-10-CM | POA: Diagnosis not present

## 2023-04-17 DIAGNOSIS — N401 Enlarged prostate with lower urinary tract symptoms: Secondary | ICD-10-CM | POA: Diagnosis not present

## 2023-06-12 DIAGNOSIS — L738 Other specified follicular disorders: Secondary | ICD-10-CM | POA: Diagnosis not present

## 2023-06-17 DIAGNOSIS — R7303 Prediabetes: Secondary | ICD-10-CM | POA: Diagnosis not present

## 2023-06-17 DIAGNOSIS — E7800 Pure hypercholesterolemia, unspecified: Secondary | ICD-10-CM | POA: Diagnosis not present

## 2023-06-17 DIAGNOSIS — J309 Allergic rhinitis, unspecified: Secondary | ICD-10-CM | POA: Diagnosis not present

## 2023-06-17 DIAGNOSIS — I10 Essential (primary) hypertension: Secondary | ICD-10-CM | POA: Diagnosis not present

## 2023-07-08 ENCOUNTER — Ambulatory Visit (HOSPITAL_COMMUNITY): Payer: BC Managed Care – PPO | Attending: Cardiology

## 2023-07-08 DIAGNOSIS — I517 Cardiomegaly: Secondary | ICD-10-CM | POA: Diagnosis not present

## 2023-07-08 LAB — ECHOCARDIOGRAM COMPLETE
Area-P 1/2: 2.85 cm2
S' Lateral: 2.5 cm

## 2023-07-31 ENCOUNTER — Other Ambulatory Visit: Payer: Self-pay

## 2023-07-31 MED ORDER — COVID-19 MRNA VAC-TRIS(PFIZER) 30 MCG/0.3ML IM SUSY
0.3000 mL | PREFILLED_SYRINGE | Freq: Once | INTRAMUSCULAR | 0 refills | Status: AC
  Filled 2023-07-31: qty 0.3, 1d supply, fill #0

## 2023-07-31 MED ORDER — INFLUENZA VIRUS VACC SPLIT PF (FLUZONE) 0.5 ML IM SUSY
0.5000 mL | PREFILLED_SYRINGE | Freq: Once | INTRAMUSCULAR | 0 refills | Status: AC
  Filled 2023-07-31: qty 0.5, 1d supply, fill #0

## 2023-08-01 ENCOUNTER — Other Ambulatory Visit: Payer: Self-pay

## 2023-09-05 ENCOUNTER — Other Ambulatory Visit: Payer: Self-pay | Admitting: Cardiology

## 2023-10-11 DIAGNOSIS — R3912 Poor urinary stream: Secondary | ICD-10-CM | POA: Diagnosis not present

## 2023-10-11 DIAGNOSIS — N401 Enlarged prostate with lower urinary tract symptoms: Secondary | ICD-10-CM | POA: Diagnosis not present

## 2023-10-18 DIAGNOSIS — N401 Enlarged prostate with lower urinary tract symptoms: Secondary | ICD-10-CM | POA: Diagnosis not present

## 2023-10-18 DIAGNOSIS — N5201 Erectile dysfunction due to arterial insufficiency: Secondary | ICD-10-CM | POA: Diagnosis not present

## 2023-10-18 DIAGNOSIS — R972 Elevated prostate specific antigen [PSA]: Secondary | ICD-10-CM | POA: Diagnosis not present

## 2023-10-18 DIAGNOSIS — N3943 Post-void dribbling: Secondary | ICD-10-CM | POA: Diagnosis not present

## 2023-12-06 DIAGNOSIS — R051 Acute cough: Secondary | ICD-10-CM | POA: Diagnosis not present

## 2023-12-06 DIAGNOSIS — Z03818 Encounter for observation for suspected exposure to other biological agents ruled out: Secondary | ICD-10-CM | POA: Diagnosis not present

## 2023-12-06 DIAGNOSIS — J101 Influenza due to other identified influenza virus with other respiratory manifestations: Secondary | ICD-10-CM | POA: Diagnosis not present

## 2023-12-06 DIAGNOSIS — R509 Fever, unspecified: Secondary | ICD-10-CM | POA: Diagnosis not present

## 2023-12-16 DIAGNOSIS — R945 Abnormal results of liver function studies: Secondary | ICD-10-CM | POA: Diagnosis not present

## 2023-12-16 DIAGNOSIS — Z125 Encounter for screening for malignant neoplasm of prostate: Secondary | ICD-10-CM | POA: Diagnosis not present

## 2023-12-16 DIAGNOSIS — I10 Essential (primary) hypertension: Secondary | ICD-10-CM | POA: Diagnosis not present

## 2023-12-16 DIAGNOSIS — E7800 Pure hypercholesterolemia, unspecified: Secondary | ICD-10-CM | POA: Diagnosis not present

## 2023-12-16 DIAGNOSIS — R7303 Prediabetes: Secondary | ICD-10-CM | POA: Diagnosis not present

## 2023-12-19 DIAGNOSIS — E118 Type 2 diabetes mellitus with unspecified complications: Secondary | ICD-10-CM | POA: Diagnosis not present

## 2023-12-19 DIAGNOSIS — I10 Essential (primary) hypertension: Secondary | ICD-10-CM | POA: Diagnosis not present

## 2023-12-19 DIAGNOSIS — Z0000 Encounter for general adult medical examination without abnormal findings: Secondary | ICD-10-CM | POA: Diagnosis not present

## 2023-12-19 DIAGNOSIS — E7800 Pure hypercholesterolemia, unspecified: Secondary | ICD-10-CM | POA: Diagnosis not present

## 2023-12-19 DIAGNOSIS — J45909 Unspecified asthma, uncomplicated: Secondary | ICD-10-CM | POA: Diagnosis not present

## 2023-12-24 DIAGNOSIS — E119 Type 2 diabetes mellitus without complications: Secondary | ICD-10-CM | POA: Diagnosis not present

## 2024-01-02 ENCOUNTER — Other Ambulatory Visit: Payer: Self-pay | Admitting: Gastroenterology

## 2024-01-02 DIAGNOSIS — K7689 Other specified diseases of liver: Secondary | ICD-10-CM | POA: Diagnosis not present

## 2024-01-02 DIAGNOSIS — R7989 Other specified abnormal findings of blood chemistry: Secondary | ICD-10-CM

## 2024-01-10 ENCOUNTER — Ambulatory Visit
Admission: RE | Admit: 2024-01-10 | Discharge: 2024-01-10 | Disposition: A | Discharge status: Home / Self Care | Source: Ambulatory Visit | Attending: Gastroenterology | Admitting: Gastroenterology

## 2024-01-10 DIAGNOSIS — R7989 Other specified abnormal findings of blood chemistry: Secondary | ICD-10-CM

## 2024-01-10 DIAGNOSIS — R7401 Elevation of levels of liver transaminase levels: Secondary | ICD-10-CM | POA: Diagnosis not present

## 2024-01-30 DIAGNOSIS — R945 Abnormal results of liver function studies: Secondary | ICD-10-CM | POA: Diagnosis not present

## 2024-04-07 DIAGNOSIS — R3912 Poor urinary stream: Secondary | ICD-10-CM | POA: Diagnosis not present

## 2024-04-07 DIAGNOSIS — N401 Enlarged prostate with lower urinary tract symptoms: Secondary | ICD-10-CM | POA: Diagnosis not present

## 2024-04-13 DIAGNOSIS — R3912 Poor urinary stream: Secondary | ICD-10-CM | POA: Diagnosis not present

## 2024-04-13 DIAGNOSIS — N3943 Post-void dribbling: Secondary | ICD-10-CM | POA: Diagnosis not present

## 2024-04-13 DIAGNOSIS — R972 Elevated prostate specific antigen [PSA]: Secondary | ICD-10-CM | POA: Diagnosis not present

## 2024-04-13 DIAGNOSIS — N401 Enlarged prostate with lower urinary tract symptoms: Secondary | ICD-10-CM | POA: Diagnosis not present

## 2024-05-25 DIAGNOSIS — E119 Type 2 diabetes mellitus without complications: Secondary | ICD-10-CM | POA: Diagnosis not present

## 2024-05-28 DIAGNOSIS — Z85828 Personal history of other malignant neoplasm of skin: Secondary | ICD-10-CM | POA: Diagnosis not present

## 2024-05-28 DIAGNOSIS — D2371 Other benign neoplasm of skin of right lower limb, including hip: Secondary | ICD-10-CM | POA: Diagnosis not present

## 2024-05-28 DIAGNOSIS — D2362 Other benign neoplasm of skin of left upper limb, including shoulder: Secondary | ICD-10-CM | POA: Diagnosis not present

## 2024-05-28 DIAGNOSIS — D225 Melanocytic nevi of trunk: Secondary | ICD-10-CM | POA: Diagnosis not present

## 2024-05-28 DIAGNOSIS — L918 Other hypertrophic disorders of the skin: Secondary | ICD-10-CM | POA: Diagnosis not present

## 2024-05-28 DIAGNOSIS — L570 Actinic keratosis: Secondary | ICD-10-CM | POA: Diagnosis not present

## 2024-05-28 DIAGNOSIS — L820 Inflamed seborrheic keratosis: Secondary | ICD-10-CM | POA: Diagnosis not present

## 2024-07-03 DIAGNOSIS — R945 Abnormal results of liver function studies: Secondary | ICD-10-CM | POA: Diagnosis not present

## 2024-07-03 DIAGNOSIS — R7989 Other specified abnormal findings of blood chemistry: Secondary | ICD-10-CM | POA: Diagnosis not present

## 2024-07-24 DIAGNOSIS — R0982 Postnasal drip: Secondary | ICD-10-CM | POA: Diagnosis not present

## 2024-08-27 ENCOUNTER — Other Ambulatory Visit: Payer: Self-pay | Admitting: Cardiology

## 2024-09-15 DIAGNOSIS — R040 Epistaxis: Secondary | ICD-10-CM | POA: Diagnosis not present

## 2024-09-22 ENCOUNTER — Other Ambulatory Visit: Payer: Self-pay | Admitting: Cardiology

## 2024-09-28 DIAGNOSIS — R945 Abnormal results of liver function studies: Secondary | ICD-10-CM | POA: Diagnosis not present

## 2024-10-01 ENCOUNTER — Telehealth: Payer: Self-pay | Admitting: Cardiology

## 2024-10-01 MED ORDER — PRAVASTATIN SODIUM 80 MG PO TABS: 80.0000 mg | ORAL_TABLET | Freq: Every day | ORAL | 0 refills | Status: DC

## 2024-10-01 NOTE — Telephone Encounter (Signed)
 Pt has appointment 11/19/24.  Sent in #55 tablets to get pt to his appointment.

## 2024-10-01 NOTE — Telephone Encounter (Signed)
*  STAT* If patient is at the pharmacy, call can be transferred to refill team.   1. Which medications need to be refilled? (please list name of each medication and dose if known)   pravastatin  (PRAVACHOL ) 80 MG tablet     2. Would you like to learn more about the convenience, safety, & potential cost savings by using the [facility]?no    3. Are you open to using the [facility] (Type [facility].  ). no   4. Which pharmacy/location (including street and city if local pharmacy) is medication to be sent to? [id number] - [place], [place] - [address]     5. Do they need a 30 day or 90 day supply? 90 day   Pt has upcoming appt

## 2024-11-09 ENCOUNTER — Other Ambulatory Visit: Payer: Self-pay

## 2024-11-09 DIAGNOSIS — M722 Plantar fascial fibromatosis: Secondary | ICD-10-CM | POA: Insufficient documentation

## 2024-11-09 DIAGNOSIS — E785 Hyperlipidemia, unspecified: Secondary | ICD-10-CM | POA: Insufficient documentation

## 2024-11-09 DIAGNOSIS — N529 Male erectile dysfunction, unspecified: Secondary | ICD-10-CM | POA: Insufficient documentation

## 2024-11-09 DIAGNOSIS — C439 Malignant melanoma of skin, unspecified: Secondary | ICD-10-CM | POA: Insufficient documentation

## 2024-11-09 DIAGNOSIS — R7989 Other specified abnormal findings of blood chemistry: Secondary | ICD-10-CM | POA: Insufficient documentation

## 2024-11-09 DIAGNOSIS — J45909 Unspecified asthma, uncomplicated: Secondary | ICD-10-CM | POA: Insufficient documentation

## 2024-11-09 DIAGNOSIS — R7303 Prediabetes: Secondary | ICD-10-CM | POA: Insufficient documentation

## 2024-11-09 DIAGNOSIS — T7840XA Allergy, unspecified, initial encounter: Secondary | ICD-10-CM | POA: Insufficient documentation

## 2024-11-09 DIAGNOSIS — M545 Low back pain, unspecified: Secondary | ICD-10-CM | POA: Insufficient documentation

## 2024-11-09 DIAGNOSIS — E118 Type 2 diabetes mellitus with unspecified complications: Secondary | ICD-10-CM | POA: Insufficient documentation

## 2024-11-09 DIAGNOSIS — L309 Dermatitis, unspecified: Secondary | ICD-10-CM | POA: Insufficient documentation

## 2024-11-09 DIAGNOSIS — K4090 Unilateral inguinal hernia, without obstruction or gangrene, not specified as recurrent: Secondary | ICD-10-CM | POA: Insufficient documentation

## 2024-11-09 DIAGNOSIS — I422 Other hypertrophic cardiomyopathy: Secondary | ICD-10-CM | POA: Insufficient documentation

## 2024-11-09 DIAGNOSIS — K759 Inflammatory liver disease, unspecified: Secondary | ICD-10-CM | POA: Insufficient documentation

## 2024-11-09 DIAGNOSIS — E7800 Pure hypercholesterolemia, unspecified: Secondary | ICD-10-CM | POA: Insufficient documentation

## 2024-11-12 ENCOUNTER — Encounter: Payer: Self-pay | Admitting: Urology

## 2024-11-12 ENCOUNTER — Other Ambulatory Visit: Payer: Self-pay | Admitting: Urology

## 2024-11-12 DIAGNOSIS — R972 Elevated prostate specific antigen [PSA]: Secondary | ICD-10-CM

## 2024-11-18 NOTE — Progress Notes (Unsigned)
 " Cardiology Office Note:   Date:  11/19/2024  ID:  Eric Kirk, DOB 1961-05-15, MRN [id number] PCP: Eric Cole, MD  [facility] Providers Cardiologist:  Lynwood Schilling, MD {  History of Present Illness:   Eric Kirk is a 64 y.o. male who was referred by Merilee, L.Addie, MD for evaluation of an abnormal EKG.   He was found to have left bundle branch block new compared to 2005 EKGs.   I sent him for an echo and he had septal hypertrophy.  He had mild coronary calcium on coronary calcium score.   He had moderate hypertrophy on MRI.    Since I last saw him he has had no overt cardiac complaints.  He did have rhinitis and allergic reaction and cough in the fall.  He is doing a lot of coughing and actually had nosebleeds in the recauterized.  At that time he was having chest discomfort.  However, he says it was with the coughing.  He was noticing it maybe a little bit after he had a coughing paroxysm.  He said that since that has gone away he has not had recurrent chest discomfort.  He is done some vigorous yard work without bringing it on.  He denies any other cardiovascular symptoms.  He denies any palpitations, presyncope or syncope.  He has had no new shortness of breath, PND or orthopnea.  ROS: Knee pain  Studies Reviewed:    EKG:   EKG Interpretation Date/Time:  Thursday November 19 2024 10:54:28 EST Ventricular Rate:  63 PR Interval:  170 QRS Duration:  166 QT Interval:  442 QTC Calculation: 452 R Axis:   -17  Text Interpretation: Normal sinus rhythm Left bundle branch block No significant change since last tracing Confirmed by Schilling Rattan ([id number]) on 11/19/2024 11:08:24 AM    Risk Assessment/Calculations:              Physical Exam:   VS:  BP 134/84   Pulse 63   Ht 5' 10.5 (1.791 m)   Wt 202 lb 9.6 oz (91.9 kg)   SpO2 94%   BMI 28.66 kg/m    Wt Readings from Last 3 Encounters:  11/19/24 202 lb 9.6 oz (91.9 kg)  09/18/22 191 lb 6.4 oz (86.8 kg)  09/11/21 193  lb 3.2 oz (87.6 kg)     GEN: Well nourished, well developed in no acute distress NECK: No JVD; No carotid bruits CARDIAC: RRR, 2 out of 6 apical systolic murmur slightly radiating at the aortic outflow tract, increasing with the strain phase of Valsalva, no diastolic murmurs, rubs, gallops RESPIRATORY:  Clear to auscultation without rales, wheezing or rhonchi  ABDOMEN: Soft, non-tender, non-distended EXTREMITIES:  No edema; No deformity   ASSESSMENT AND PLAN:   SEPTAL HYPERTROPHY:     He had some mild septal hypertrophy without high risk features.  I have not screened him however for arrhythmias and I will do a 3-day Zio patch.  He has no other symptoms and no further testing is indicated at this point.  He does agree to see our geneticist.   CORONARY CALCIUM:   He had mild coronary calcium and he will continue with risk reduction.     HTN:   His BP is at target.  No change in therapy.   DYSLIPIDEMIA:   I am trying to get his LDL probably down into the 70s.  Has been out of his pravastatin .  I would like to renew this.  I  have suggested a lipid profile 3 months after starting that.   LBBB:  This is chronic  No further testing.   Follow up with me in one year.   Signed, Lynwood Schilling, MD   "

## 2024-11-19 ENCOUNTER — Encounter: Payer: Self-pay | Admitting: Cardiology

## 2024-11-19 ENCOUNTER — Ambulatory Visit: Attending: Cardiology | Admitting: Cardiology

## 2024-11-19 ENCOUNTER — Ambulatory Visit

## 2024-11-19 VITALS — BP 134/84 | HR 63 | Ht 70.5 in | Wt 202.6 lb

## 2024-11-19 DIAGNOSIS — I421 Obstructive hypertrophic cardiomyopathy: Secondary | ICD-10-CM

## 2024-11-19 DIAGNOSIS — E785 Hyperlipidemia, unspecified: Secondary | ICD-10-CM

## 2024-11-19 DIAGNOSIS — I10 Essential (primary) hypertension: Secondary | ICD-10-CM | POA: Diagnosis not present

## 2024-11-19 DIAGNOSIS — I517 Cardiomegaly: Secondary | ICD-10-CM | POA: Diagnosis not present

## 2024-11-19 DIAGNOSIS — R931 Abnormal findings on diagnostic imaging of heart and coronary circulation: Secondary | ICD-10-CM

## 2024-11-19 MED ORDER — PRAVASTATIN SODIUM 80 MG PO TABS: 80.0000 mg | ORAL_TABLET | Freq: Every day | ORAL | 3 refills | Status: AC

## 2024-11-19 NOTE — Patient Instructions (Addendum)
 Medication Instructions:  Refilled Pravastatin  *If you need a refill on your cardiac medications before your next appointment, please call your pharmacy*  Lab Work: Fasting lipid panel in 3 months If you have labs (blood work) drawn today and your tests are completely normal, you will receive your results only by: MyChart Message (if you have MyChart) OR A paper copy in the mail If you have any lab test that is abnormal or we need to change your treatment, we will call you to review the results.  Testing/Procedures: 3 Day Zio Heart Monitor Your physician has requested that you wear a Zio heart monitor for __3___ days. This will be mailed to your home with instructions on how to apply the monitor and how to return it when finished. Please allow 2 weeks after returning the heart monitor before our office calls you with the results.   Follow-Up: At [facility], you and your health needs are our priority.  As part of our continuing mission to provide you with exceptional heart care, our providers are all part of one team.  This team includes your primary Cardiologist (physician) and Advanced Practice Providers or APPs (Physician Assistants and Nurse Practitioners) who all work together to provide you with the care you need, when you need it.  Your next appointment:   1 year(s)  Provider:   Lynwood Schilling, MD    We recommend signing up for the patient portal called MyChart.  Sign up information is provided on this After Visit Summary.  MyChart is used to connect with patients for Virtual Visits (Telemedicine).  Patients are able to view lab/test results, encounter notes, upcoming appointments, etc.  Non-urgent messages can be sent to your provider as well.   To learn more about what you can do with MyChart, go to [email or web address].   Other Instructions Referral to Dr. Rudolph Pac has been placed. Someone will reach out to you to schedule an appointment.   ZIO XT- Long Term  Monitor Instructions  Your physician has requested you wear a ZIO patch monitor for 3 days.  This is a single patch monitor. [employer] supplies one patch monitor per enrollment. Additional stickers are not available. Please do not apply patch if you will be having a Nuclear Stress Test,  Echocardiogram, Cardiac CT, MRI, or Chest Xray during the period you would be wearing the  monitor. The patch cannot be worn during these tests. You cannot remove and re-apply the  ZIO XT patch monitor.  Your ZIO patch monitor will be mailed 3 day USPS to your address on file. It may take 3-5 days  to receive your monitor after you have been enrolled.  Once you have received your monitor, please review the enclosed instructions. Your monitor  has already been registered assigning a specific monitor serial # to you.  Billing and Patient Assistance Program Information  We have supplied [employer] with any of your insurance information on file for billing purposes. [employer] offers a sliding scale Patient Assistance Program for patients that do not have  insurance, or whose insurance does not completely cover the cost of the ZIO monitor.  You must apply for the Patient Assistance Program to qualify for this discounted rate.  To apply, please call [employer] at [phone], select option 4, select option 2, ask to apply for  Patient Assistance Program. Meredeth will ask your household income, and how many people  are in your household. They will quote your out-of-pocket cost based on that information.  [employer]  will also be able to set up a 48-month, interest-free payment plan if needed.  Applying the monitor   Shave hair from upper left chest.  Hold abrader disc by orange tab. Rub abrader in 40 strokes over the upper left chest as  indicated in your monitor instructions.  Clean area with 4 enclosed alcohol pads. Let dry.  Apply patch as indicated in monitor instructions. Patch will be placed under collarbone on left   side of chest with arrow pointing upward.  Rub patch adhesive wings for 2 minutes. Remove white label marked 1. Remove the white  label marked 2. Rub patch adhesive wings for 2 additional minutes.  While looking in a mirror, press and release button in center of patch. A small green light will  flash 3-4 times. This will be your only indicator that the monitor has been turned on.  Do not shower for the first 24 hours. You may shower after the first 24 hours.  Press the button if you feel a symptom. You will hear a small click. Record Date, Time and  Symptom in the Patient Logbook.  When you are ready to remove the patch, follow instructions on the last 2 pages of Patient  Logbook. Stick patch monitor onto the last page of Patient Logbook.  Place Patient Logbook in the blue and white box. Use locking tab on box and tape box closed  securely. The blue and white box has prepaid postage on it. Please place it in the mailbox as  soon as possible. Your physician should have your test results approximately 7 days after the  monitor has been mailed back to [facility].  Call [facility] Customer Care at [phone] if you have questions regarding  your ZIO XT patch monitor. Call them immediately if you see an orange light blinking on your  monitor.  If your monitor falls off in less than 4 days, contact our Monitor department at [phone].  If your monitor becomes loose or falls off after 4 days call [employer] at [phone] for  suggestions on securing your monitor

## 2024-11-19 NOTE — Progress Notes (Unsigned)
 Enrolled for [employer] to mail a ZIO XT long term holter monitor to the patients address on file.

## 2024-12-14 ENCOUNTER — Other Ambulatory Visit

## 2025-01-05 ENCOUNTER — Ambulatory Visit: Admitting: Genetic Counselor
